# Patient Record
Sex: Female | Born: 2005 | Hispanic: No | Marital: Single | Smoking: Never smoker
Health system: Southern US, Community
[De-identification: ages and names within clinical notes are randomized; demographics above are authoritative.]

## PROBLEM LIST (undated history)

## (undated) ENCOUNTER — Ambulatory Visit: Admission: EM

## (undated) DIAGNOSIS — F909 Attention-deficit hyperactivity disorder, unspecified type: Secondary | ICD-10-CM

## (undated) DIAGNOSIS — J45909 Unspecified asthma, uncomplicated: Secondary | ICD-10-CM

## (undated) DIAGNOSIS — R519 Headache, unspecified: Secondary | ICD-10-CM

## (undated) DIAGNOSIS — R51 Headache: Secondary | ICD-10-CM

## (undated) DIAGNOSIS — F32A Depression, unspecified: Secondary | ICD-10-CM

## (undated) DIAGNOSIS — F419 Anxiety disorder, unspecified: Secondary | ICD-10-CM

## (undated) HISTORY — DX: Anxiety disorder, unspecified: F41.9

## (undated) HISTORY — DX: Headache: R51

## (undated) HISTORY — DX: Headache, unspecified: R51.9

## (undated) HISTORY — DX: Unspecified asthma, uncomplicated: J45.909

## (undated) HISTORY — DX: Attention-deficit hyperactivity disorder, unspecified type: F90.9

---
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
## 2023-01-06 ENCOUNTER — Ambulatory Visit (INDEPENDENT_AMBULATORY_CARE_PROVIDER_SITE_OTHER): Payer: Medicaid Other | Admitting: Women's Health

## 2023-01-06 ENCOUNTER — Encounter: Payer: Self-pay | Admitting: Women's Health

## 2023-01-06 VITALS — BP 128/58 | HR 92 | Wt 166.0 lb

## 2023-01-06 DIAGNOSIS — Z3483 Encounter for supervision of other normal pregnancy, third trimester: Secondary | ICD-10-CM

## 2023-01-06 DIAGNOSIS — Z3A32 32 weeks gestation of pregnancy: Secondary | ICD-10-CM

## 2023-01-06 DIAGNOSIS — Z3480 Encounter for supervision of other normal pregnancy, unspecified trimester: Secondary | ICD-10-CM

## 2023-01-06 NOTE — Patient Instructions (Signed)
Nichole Ramirez, thank you for choosing our office today! We appreciate the opportunity to meet your healthcare needs. You may receive a short survey by mail, e-mail, or through [employer]. If you are happy with your care we would appreciate if you could take just a few minutes to complete the survey questions. We read all of your comments and take your feedback very seriously. Thank you again for choosing our office.  Center for [employer] Team at [place] at [facility] ([place], [place] [address]) Entrance C, located off of [address] parking   CLASSES: Go to [email or web address] to register for classes (childbirth, breastfeeding, waterbirth, infant CPR, daddy bootcamp, etc.)  Call the office [phone]) or go to [facility] if: You begin to have strong, frequent contractions Your water breaks.  Sometimes it is a big gush of fluid, sometimes it is just a trickle that keeps getting your panties wet or running down your legs You have vaginal bleeding.  It is normal to have a small amount of spotting if your cervix was checked.  You don't feel your baby moving like normal.  If you don't, get you something to eat and drink and lay down and focus on feeling your baby move.   If your baby is still not moving like normal, you should call the office or go to [facility]  Call the office [phone]) or go to [facility] hospital for these signs of pre-eclampsia: Severe headache that does not go away with Tylenol Visual changes- seeing spots, double, blurred vision Pain under your right breast or upper abdomen that does not go away with Tums or heartburn medicine Nausea and/or vomiting Severe swelling in your hands, feet, and face   Tdap Vaccine It is recommended that you get the Tdap vaccine during the third trimester of EACH pregnancy to help protect your baby from getting pertussis (whooping cough) 27-36 weeks is the BEST time to do  this so that you can pass the protection on to your baby. During pregnancy is better than after pregnancy, but if you are unable to get it during pregnancy it will be offered at the hospital.  You can get this vaccine with [place], at the health department, your family doctor, or some local pharmacies Everyone who will be around your baby should also be up-to-date on their vaccines before the baby comes. Adults (who are not pregnant) only need 1 dose of Tdap during adulthood.   [facility] Pediatricians/Family Doctors [facility]): [address] [place], [facility]: [address] [place], [phone]                [facility]): [place], [phone] (call to ask if accepting patients) [facility] Department: [address], [place], [place] Pediatricians/Family Doctors [facility]): [place]. [place], [address], [facility]: [address] [place], [facility]: [place], [place] [facility]): [phone] [facility]: [address] [place]: [address], [facility]: [phone], [phone]  Home Blood Pressure Monitoring for Patients   Your provider has recommended that you check your  blood pressure (BP) at least once a week at home. If you do not have a blood pressure cuff at home, one will be provided for you. Contact your provider if you have not received your monitor within 1 week.   Helpful Tips for Accurate Home Blood Pressure Checks  Don't smoke, exercise, or drink caffeine 30 minutes before checking your BP Use the restroom before checking your BP (a full bladder can raise your  pressure) Relax in a comfortable upright chair Feet on the ground Left arm resting comfortably on a flat surface at the level of your heart Legs uncrossed Back supported Sit quietly and don't talk Place the cuff on your bare arm Adjust snuggly, so that only two fingertips can fit between your skin and the top of the cuff Check 2 readings separated by at least one minute Keep a log of your BP readings For a visual, please reference this diagram: http://ccnc.care/bpdiagram  Provider Name: [facility]     Phone: [phone]  Zone 1: ALL CLEAR  Continue to monitor your symptoms:  BP reading is less than 140 (top number) or less than 90 (bottom number)  No right upper stomach pain No headaches or seeing spots No feeling nauseated or throwing up No swelling in face and hands  Zone 2: CAUTION Call your doctor's office for any of the following:  BP reading is greater than 140 (top number) or greater than 90 (bottom number)  Stomach pain under your ribs in the middle or right side Headaches or seeing spots Feeling nauseated or throwing up Swelling in face and hands  Zone 3: EMERGENCY  Seek immediate medical care if you have any of the following:  BP reading is greater than160 (top number) or greater than 110 (bottom number) Severe headaches not improving with Tylenol Serious difficulty catching your breath Any worsening symptoms from Zone 2  Preterm Labor and Birth Information  The normal length of a pregnancy is 39-41 weeks. Preterm labor is when labor starts before 37 completed weeks of pregnancy. What are the risk factors for preterm labor? Preterm labor is more likely to occur in women who: Have certain infections during pregnancy such as a bladder infection, sexually transmitted infection, or infection inside the uterus (chorioamnionitis). Have a shorter-than-normal cervix. Have gone into preterm labor before. Have had surgery on their cervix. Are younger than age 17  or older than age 35. Are African American. Are pregnant with twins or multiple babies (multiple gestation). Take street drugs or smoke while pregnant. Do not gain enough weight while pregnant. Became pregnant shortly after having been pregnant. What are the symptoms of preterm labor? Symptoms of preterm labor include: Cramps similar to those that can happen during a menstrual period. The cramps may happen with diarrhea. Pain in the abdomen or lower back. Regular uterine contractions that may feel like tightening of the abdomen. A feeling of increased pressure in the pelvis. Increased watery or bloody mucus discharge from the vagina. Water breaking (ruptured amniotic sac). Why is it important to recognize signs of preterm labor? It is important to recognize signs of preterm labor because babies who are born prematurely may not be fully developed. This can put them at an increased risk for: Long-term (chronic) heart and lung problems. Difficulty immediately after birth with regulating body systems, including blood sugar, body temperature, heart rate, and breathing rate. Bleeding in the brain. Cerebral palsy. Learning difficulties. Death. These risks are highest for babies who are born before 34 weeks   of pregnancy. How is preterm labor treated? Treatment depends on the length of your pregnancy, your condition, and the health of your baby. It may involve: Having a stitch (suture) placed in your cervix to prevent your cervix from opening too early (cerclage). Taking or being given medicines, such as: Hormone medicines. These may be given early in pregnancy to help support the pregnancy. Medicine to stop contractions. Medicines to help mature the baby's lungs. These may be prescribed if the risk of delivery is high. Medicines to prevent your baby from developing cerebral palsy. If the labor happens before 34 weeks of pregnancy, you may need to stay in the hospital. What should I do if I  think I am in preterm labor? If you think that you are going into preterm labor, call your health care provider right away. How can I prevent preterm labor in future pregnancies? To increase your chance of having a full-term pregnancy: Do not use any tobacco products, such as cigarettes, chewing tobacco, and e-cigarettes. If you need help quitting, ask your health care provider. Do not use street drugs or medicines that have not been prescribed to you during your pregnancy. Talk with your health care provider before taking any herbal supplements, even if you have been taking them regularly. Make sure you gain a healthy amount of weight during your pregnancy. Watch for infection. If you think that you might have an infection, get it checked right away. Make sure to tell your health care provider if you have gone into preterm labor before. This information is not intended to replace advice given to you by your health care provider. Make sure you discuss any questions you have with your health care provider. Document Revised: 01/14/2019 Document Reviewed: 02/13/2016 Elsevier Patient Education  2020 [employer] Inc.   

## 2023-01-06 NOTE — Progress Notes (Signed)
LOW-RISK PREGNANCY VISIT Patient name: Nichole Ramirez MRN [id number]  Date of birth: 08-Oct-2005 Chief Complaint:   Routine Prenatal Visit  History of Present Illness:   Nichole Ramirez is a 17 y.o. G33P0010 female at [redacted]w[redacted]d with an Estimated Date of Delivery: 03/02/23 being seen today for ongoing management of a low-risk pregnancy.   Today she reports no complaints. Hasn't gotten bp cuff yet.  Contractions: Irritability.  .  Movement: Present. denies leaking of fluid.     08/20/2022   10:41 AM 07/10/2021    4:32 PM 06/05/2021    3:43 PM 01/04/2021   12:49 PM  Depression screen PHQ 2/9  Decreased Interest 0  0 0  Down, Depressed, Hopeless 1  1 0  PHQ - 2 Score 1  1 0  Altered sleeping 1  1 2   Tired, decreased energy 2  1 0  Change in appetite 1  0 0  Feeling bad or failure about yourself  0  0 0  Trouble concentrating 2  2 1   Moving slowly or fidgety/restless 0  0 0  Suicidal thoughts 0  0 0  PHQ-9 Score 7  5 3   Difficult doing work/chores    Not difficult at all     Information is confidential and restricted. Go to Review Flowsheets to unlock data.        08/20/2022   10:42 AM 06/05/2021    3:45 PM  GAD 7 : Generalized Anxiety Score  Nervous, Anxious, on Edge 1 1  Control/stop worrying 0 0  Worry too much - different things 0 1  Trouble relaxing 0 0  Restless 0 0  Easily annoyed or irritable 1 1  Afraid - awful might happen 0 0  Total GAD 7 Score 2 3      Review of Systems:   Pertinent items are noted in HPI Denies abnormal vaginal discharge w/ itching/odor/irritation, headaches, visual changes, shortness of breath, chest pain, abdominal pain, severe nausea/vomiting, or problems with urination or bowel movements unless otherwise stated above. Pertinent History Reviewed:  Reviewed past medical,surgical, social, obstetrical and family history.  Reviewed problem list, medications and allergies. Physical Assessment:   Vitals:   01/06/23 1337  BP: (!) 128/58  Pulse:  92  Weight: 166 lb (75.3 kg)  There is no height or weight on file to calculate BMI.        Physical Examination:   General appearance: Well appearing, and in no distress  Mental status: Alert, oriented to person, place, and time  Skin: Warm & dry  Cardiovascular: Normal heart rate noted  Respiratory: Normal respiratory effort, no distress  Abdomen: Soft, gravid, nontender  Pelvic: Cervical exam deferred         Extremities: Edema: None  Fetal Status: Fetal Heart Rate (bpm): 145 Fundal Height: 29 cm Movement: Present    Chaperone: N/A   No results found for this or any previous visit (from the past 24 hour(s)).  Assessment & Plan:  1) Low-risk pregnancy G2P0010 at [redacted]w[redacted]d with an Estimated Date of Delivery: 03/02/23   2) S<D> EFW for same @ 31wks, 67% w/ normal fluid   Meds: No orders of the defined types were placed in this encounter.  Labs/procedures today: none, note routed to Tish to check on bp cuff  Plan:  Continue routine obstetrical care  Next visit: prefers in person    Reviewed: Preterm labor symptoms and general obstetric precautions including but not limited to vaginal bleeding, contractions, leaking  of fluid and fetal movement were reviewed in detail with the patient.  All questions were answered.   Follow-up: Return in about 2 weeks (around 01/20/2023) for [place], [place], in person.  Future Appointments  Date Time Provider [place]  01/20/2023  2:30 PM Roma Schanz, CNM [facility]    No orders of the defined types were placed in this encounter.  [place], [facility] 01/06/2023 2:09 PM

## 2023-01-20 ENCOUNTER — Ambulatory Visit (INDEPENDENT_AMBULATORY_CARE_PROVIDER_SITE_OTHER): Payer: Medicaid Other | Admitting: Women's Health

## 2023-01-20 ENCOUNTER — Encounter: Payer: Self-pay | Admitting: Women's Health

## 2023-01-20 VITALS — BP 114/70 | HR 76 | Wt 165.8 lb

## 2023-01-20 DIAGNOSIS — Z3A34 34 weeks gestation of pregnancy: Secondary | ICD-10-CM

## 2023-01-20 DIAGNOSIS — Z3483 Encounter for supervision of other normal pregnancy, third trimester: Secondary | ICD-10-CM

## 2023-01-20 NOTE — Progress Notes (Signed)
LOW-RISK PREGNANCY VISIT Patient name: Nichole Ramirez MRN [id number]  Date of birth: Jun 24, 2006 Chief Complaint:   Routine Prenatal Visit  History of Present Illness:   Nichole Ramirez is a 17 y.o. G2P0010 female at [redacted]w[redacted]d with an Estimated Date of Delivery: 03/02/23 being seen today for ongoing management of a low-risk pregnancy.   Today she reports round ligament pain. Contractions: Not present. Vag. Bleeding: None.  Movement: Present. denies leaking of fluid.     08/20/2022   10:41 AM 07/10/2021    4:32 PM 06/05/2021    3:43 PM 01/04/2021   12:49 PM  Depression screen PHQ 2/9  Decreased Interest 0  0 0  Down, Depressed, Hopeless 1  1 0  PHQ - 2 Score 1  1 0  Altered sleeping Tired, decreased energy 2  1 0  Change in appetite 1  0 0  Feeling bad or failure about yourself  0  0 0  Trouble concentrating Moving slowly or fidgety/restless 0  0 0  Suicidal thoughts 0  0 0  PHQ-9 Score Difficult doing work/chores    Not difficult at all     Information is confidential and restricted. Go to Review Flowsheets to unlock data.        08/20/2022   10:42 AM 06/05/2021    3:45 PM  GAD 7 : Generalized Anxiety Score  Nervous, Anxious, on Edge 1 1  Control/stop worrying 0 0  Worry too much - different things 0 1  Trouble relaxing 0 0  Restless 0 0  Easily annoyed or irritable 1 1  Afraid - awful might happen 0 0  Total GAD 7 Score 2 3      Review of Systems:   Pertinent items are noted in HPI Denies abnormal vaginal discharge w/ itching/odor/irritation, headaches, visual changes, shortness of breath, chest pain, abdominal pain, severe nausea/vomiting, or problems with urination or bowel movements unless otherwise stated above. Pertinent History Reviewed:  Reviewed past medical,surgical, social, obstetrical and family history.  Reviewed problem list, medications and allergies. Physical Assessment:   Vitals:   01/20/23 1439  BP: 114/70  Pulse: 76   Weight: 165 lb 12.8 oz (75.2 kg)  There is no height or weight on file to calculate BMI.        Physical Examination:   General appearance: Well appearing, and in no distress  Mental status: Alert, oriented to person, place, and time  Skin: Warm & dry  Cardiovascular: Normal heart rate noted  Respiratory: Normal respiratory effort, no distress  Abdomen: Soft, gravid, nontender  Pelvic: Cervical exam deferred         Extremities:    Fetal Status: Fetal Heart Rate (bpm): 138 Fundal Height: 31 cm Movement: Present    Chaperone: N/A   No results found for this or any previous visit (from the past 24 hour(s)).  Assessment & Plan:  1) Low-risk pregnancy G2P0010 at [redacted]w[redacted]d with an Estimated Date of Delivery: 03/02/23   2) Measuring size <dates, EFW 67% @ 31wks   Meds: No orders of the defined types were placed in this encounter.  Labs/procedures today: none  Plan:  Continue routine obstetrical care  Next visit: prefers will be in person for cultures     Reviewed: Preterm labor symptoms and general obstetric precautions including but not limited to vaginal bleeding, contractions, leaking of fluid and fetal movement were reviewed in detail  with the patient.  All questions were answered. Does have home bp cuff. Office bp cuff given: not applicable. Check bp weekly, let us know if consistently >140 and/or >90.  Follow-up: Return in about 2 weeks (around 02/03/2023) for [facility], CNM, in person.  No future appointments.  No orders of the defined types were placed in this encounter.  Cheral Marker CNM, [facility] 01/20/2023 2:57 PM

## 2023-01-20 NOTE — Patient Instructions (Signed)
Nichole Ramirez, thank you for choosing our office today! We appreciate the opportunity to meet your healthcare needs. You may receive a short survey by mail, e-mail, or through MyChart. If you are happy with your care we would appreciate if you could take just a few minutes to complete the survey questions. We read all of your comments and take your feedback very seriously. Thank you again for choosing our office.  [facility] ([address] [place], [place] [address]) Entrance C, located off of [address] Free 24/7 valet parking   CLASSES: Go to [email or web address] to register for classes (childbirth, breastfeeding, waterbirth, infant CPR, daddy bootcamp, etc.)  Call the office ([phone]) or go to [facility] if: You begin to have strong, frequent contractions Your water breaks.  Sometimes it is a big gush of fluid, sometimes it is just a trickle that keeps getting your panties wet or running down your legs You have vaginal bleeding.  It is normal to have a small amount of spotting if your cervix was checked.  You don't feel your baby moving like normal.  If you don't, get you something to eat and drink and lay down and focus on feeling your baby move.   If your baby is still not moving like normal, you should call the office or go to [facility].  Call the office ([phone]) or go to [facility] for these signs of pre-eclampsia: Severe headache that does not go away with Tylenol Visual changes- seeing spots, double, blurred vision Pain under your right breast or upper abdomen that does not go away with Tums or heartburn medicine Nausea and/or vomiting Severe swelling in your hands, feet, and face   Tdap Vaccine It is recommended that you get the Tdap vaccine during the third trimester of EACH pregnancy to help protect your baby from getting pertussis (whooping cough) 27-36 weeks is the BEST time to do  this so that you can pass the protection on to your baby. During pregnancy is better than after pregnancy, but if you are unable to get it during pregnancy it will be offered at the hospital.  You can get this vaccine with us, at the health department, your family doctor, or some local pharmacies Everyone who will be around your baby should also be up-to-date on their vaccines before the baby comes. Adults (who are not pregnant) only need 1 dose of Tdap during adulthood.   [place] Pediatricians/Family Doctors [facility] ([facility]): [address], [phone]           [facility]: [address], [phone]                [facility] ([facility]): [address], [phone] (call to ask if accepting patients) [facility]: [address], [place], [phone]    [place] Pediatricians/Family Doctors [facility] ([facility]): [address], [phone] [facility]: [address], [phone] [facility]: [address], [phone]  [place] [facility]  [facility] ([facility]): [phone] [facility]: [address], [phone]   [place] Family [facility]: [address], [phone]  [place] [facility]  [facility]: [address], [phone]  Home Blood Pressure Monitoring for Patients   Your provider has recommended that you check your   blood pressure (BP) at least once a week at home. If you do not have a blood pressure cuff at home, one will be provided for you. Contact your provider if you have not received your monitor within 1 week.   Helpful Tips for Accurate Home Blood Pressure Checks  Don't smoke, exercise, or drink caffeine 30 minutes before checking your BP Use the restroom before checking your BP (a full bladder can raise your  pressure) Relax in a comfortable upright chair Feet on the ground Left arm resting comfortably on a flat surface at the level of your heart Legs uncrossed Back supported Sit quietly and don't talk Place the cuff on your bare arm Adjust snuggly, so that only two fingertips can fit between your skin and the top of the cuff Check 2 readings separated by at least one minute Keep a log of your BP readings For a visual, please reference this diagram: http://ccnc.care/bpdiagram  Provider Name: [facility]     Phone: [phone]  Zone 1: ALL CLEAR  Continue to monitor your symptoms:  BP reading is less than 140 (top number) or less than 90 (bottom number)  No right upper stomach pain No headaches or seeing spots No feeling nauseated or throwing up No swelling in face and hands  Zone 2: CAUTION Call your doctor's office for any of the following:  BP reading is greater than 140 (top number) or greater than 90 (bottom number)  Stomach pain under your ribs in the middle or right side Headaches or seeing spots Feeling nauseated or throwing up Swelling in face and hands  Zone 3: EMERGENCY  Seek immediate medical care if you have any of the following:  BP reading is greater than160 (top number) or greater than 110 (bottom number) Severe headaches not improving with Tylenol Serious difficulty catching your breath Any worsening symptoms from Zone 2  Preterm Labor and Birth Information  The normal length of a pregnancy is 39-41 weeks. Preterm labor is when labor starts before 37 completed weeks of pregnancy. What are the risk factors for preterm labor? Preterm labor is more likely to occur in women who: Have certain infections during pregnancy such as a bladder infection, sexually transmitted infection, or infection inside the uterus (chorioamnionitis). Have a shorter-than-normal cervix. Have gone into preterm labor before. Have had surgery on their cervix. Are younger than age 17  or older than age 35. Are African American. Are pregnant with twins or multiple babies (multiple gestation). Take street drugs or smoke while pregnant. Do not gain enough weight while pregnant. Became pregnant shortly after having been pregnant. What are the symptoms of preterm labor? Symptoms of preterm labor include: Cramps similar to those that can happen during a menstrual period. The cramps may happen with diarrhea. Pain in the abdomen or lower back. Regular uterine contractions that may feel like tightening of the abdomen. A feeling of increased pressure in the pelvis. Increased watery or bloody mucus discharge from the vagina. Water breaking (ruptured amniotic sac). Why is it important to recognize signs of preterm labor? It is important to recognize signs of preterm labor because babies who are born prematurely may not be fully developed. This can put them at an increased risk for: Long-term (chronic) heart and lung problems. Difficulty immediately after birth with regulating body systems, including blood sugar, body temperature, heart rate, and breathing rate. Bleeding in the brain. Cerebral palsy. Learning difficulties. Death. These risks are highest for babies who are born before 34 weeks   of pregnancy. How is preterm labor treated? Treatment depends on the length of your pregnancy, your condition, and the health of your baby. It may involve: Having a stitch (suture) placed in your cervix to prevent your cervix from opening too early (cerclage). Taking or being given medicines, such as: Hormone medicines. These may be given early in pregnancy to help support the pregnancy. Medicine to stop contractions. Medicines to help mature the baby's lungs. These may be prescribed if the risk of delivery is high. Medicines to prevent your baby from developing cerebral palsy. If the labor happens before 34 weeks of pregnancy, you may need to stay in the hospital. What should I do if I  think I am in preterm labor? If you think that you are going into preterm labor, call your health care provider right away. How can I prevent preterm labor in future pregnancies? To increase your chance of having a full-term pregnancy: Do not use any tobacco products, such as cigarettes, chewing tobacco, and e-cigarettes. If you need help quitting, ask your health care provider. Do not use street drugs or medicines that have not been prescribed to you during your pregnancy. Talk with your health care provider before taking any herbal supplements, even if you have been taking them regularly. Make sure you gain a healthy amount of weight during your pregnancy. Watch for infection. If you think that you might have an infection, get it checked right away. Make sure to tell your health care provider if you have gone into preterm labor before. This information is not intended to replace advice given to you by your health care provider. Make sure you discuss any questions you have with your health care provider. Document Revised: 01/14/2019 Document Reviewed: 02/13/2016 Elsevier Patient Education  2020 Elsevier Inc.   

## 2023-02-03 ENCOUNTER — Ambulatory Visit (INDEPENDENT_AMBULATORY_CARE_PROVIDER_SITE_OTHER): Payer: Medicaid Other | Admitting: Obstetrics and Gynecology

## 2023-02-03 ENCOUNTER — Encounter: Payer: Self-pay | Admitting: Obstetrics and Gynecology

## 2023-02-03 ENCOUNTER — Other Ambulatory Visit (HOSPITAL_COMMUNITY)
Admission: RE | Admit: 2023-02-03 | Discharge: 2023-02-03 | Disposition: A | Discharge status: Home / Self Care | Payer: Medicaid Other | Source: Ambulatory Visit | Attending: Obstetrics and Gynecology | Admitting: Obstetrics and Gynecology

## 2023-02-03 VITALS — BP 129/70 | HR 77 | Wt 170.2 lb

## 2023-02-03 DIAGNOSIS — O99013 Anemia complicating pregnancy, third trimester: Secondary | ICD-10-CM

## 2023-02-03 DIAGNOSIS — Z3A36 36 weeks gestation of pregnancy: Secondary | ICD-10-CM | POA: Diagnosis not present

## 2023-02-03 DIAGNOSIS — Z3480 Encounter for supervision of other normal pregnancy, unspecified trimester: Secondary | ICD-10-CM | POA: Diagnosis not present

## 2023-02-03 DIAGNOSIS — R8271 Bacteriuria: Secondary | ICD-10-CM

## 2023-02-03 NOTE — Progress Notes (Signed)
Subjective:  Nichole Ramirez is a 17 y.o. G2P0010 at [redacted]w[redacted]d being seen today for ongoing prenatal care.  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  Patient reports  general discomforts of pregnancy .  Contractions: Irregular. Vag. Bleeding: None.  Movement: Present. Denies leaking of fluid.   The following portions of the patient's history were reviewed and updated as appropriate: allergies, current medications, past family history, past medical history, past social history, past surgical history and problem list. Problem list updated.  Objective:   Vitals:   02/03/23 1417  BP: 129/70  Pulse: 77  Weight: 170 lb 3.2 oz (77.2 kg)    Fetal Status:     Movement: Present     General:  Alert, oriented and cooperative. Patient is in no acute distress.  Skin: Skin is warm and dry. No rash noted.   Cardiovascular: Normal heart rate noted  Respiratory: Normal respiratory effort, no problems with respiration noted  Abdomen: Soft, gravid, appropriate for gestational age. Pain/Pressure: Present     Pelvic:  Cervical exam performed        Extremities: Normal range of motion.     Mental Status: Normal mood and affect. Normal behavior. Normal judgment and thought content.   Urinalysis:      Assessment and Plan:  Pregnancy: G2P0010 at [redacted]w[redacted]d  1. [redacted] weeks gestation of pregnancy  - Cervicovaginal ancillary only( [place])  2. Supervision of other normal pregnancy, antepartum Stable Labor precautions - Cervicovaginal ancillary only( [place])  3. Group B streptococcal bacteriuria Tx while in labor  4. Anemia affecting pregnancy in third trimester Continue with iron supplement  Term labor symptoms and general obstetric  precautions including but not limited to vaginal bleeding, contractions, leaking of fluid and fetal movement were reviewed in detail with the patient. Please refer to After Visit Summary for other counseling recommendations.  Return in about 1 week (around 02/10/2023) for OB visit, face to face, any provider.   Hermina Staggers, MD

## 2023-02-05 LAB — CERVICOVAGINAL ANCILLARY ONLY
Chlamydia: NEGATIVE
Comment: NEGATIVE
Comment: NORMAL
Neisseria Gonorrhea: NEGATIVE

## 2023-02-10 ENCOUNTER — Encounter: Payer: Self-pay | Admitting: Women's Health

## 2023-02-10 ENCOUNTER — Ambulatory Visit (INDEPENDENT_AMBULATORY_CARE_PROVIDER_SITE_OTHER): Payer: Medicaid Other | Admitting: Women's Health

## 2023-02-10 VITALS — BP 115/66 | HR 69 | Wt 174.0 lb

## 2023-02-10 DIAGNOSIS — Z3480 Encounter for supervision of other normal pregnancy, unspecified trimester: Secondary | ICD-10-CM

## 2023-02-10 DIAGNOSIS — Z3483 Encounter for supervision of other normal pregnancy, third trimester: Secondary | ICD-10-CM

## 2023-02-10 DIAGNOSIS — Z3A37 37 weeks gestation of pregnancy: Secondary | ICD-10-CM

## 2023-02-10 NOTE — Progress Notes (Signed)
LOW-RISK PREGNANCY VISIT Patient name: Nichole Ramirez MRN [id number]  Date of birth: August 09, 2006 Chief Complaint:   Routine Prenatal Visit (Need refill vitamin)  History of Present Illness:   Nichole Ramirez is a 17 y.o. G18P0010 female at [redacted]w[redacted]d with an Estimated Date of Delivery: 03/02/23 being seen today for ongoing management of a low-risk pregnancy.   Today she reports no complaints. Contractions: Irregular.  .  Movement: Present. denies leaking of fluid.     08/20/2022   10:41 AM 07/10/2021    4:32 PM 06/05/2021    3:43 PM 01/04/2021   12:49 PM  Depression screen PHQ 2/9  Decreased Interest 0  0 0  Down, Depressed, Hopeless 1  1 0  PHQ - 2 Score 1  1 0  Altered sleeping 1  1 2   Tired, decreased energy 2  1 0  Change in appetite 1  0 0  Feeling bad or failure about yourself  0  0 0  Trouble concentrating 2  2 1   Moving slowly or fidgety/restless 0  0 0  Suicidal thoughts 0  0 0  PHQ-9 Score 7  5 3   Difficult doing work/chores    Not difficult at all     Information is confidential and restricted. Go to Review Flowsheets to unlock data.        08/20/2022   10:42 AM 06/05/2021    3:45 PM  GAD 7 : Generalized Anxiety Score  Nervous, Anxious, on Edge 1 1  Control/stop worrying 0 0  Worry too much - different things 0 1  Trouble relaxing 0 0  Restless 0 0  Easily annoyed or irritable 1 1  Afraid - awful might happen 0 0  Total GAD 7 Score 2 3      Review of Systems:   Pertinent items are noted in HPI Denies abnormal vaginal discharge w/ itching/odor/irritation, headaches, visual changes, shortness of breath, chest pain, abdominal pain, severe nausea/vomiting, or problems with urination or bowel movements unless otherwise stated above. Pertinent History Reviewed:  Reviewed past medical,surgical, social, obstetrical and family history.  Reviewed problem list, medications and allergies. Physical Assessment:   Vitals:   02/10/23 1418  BP: 115/66  Pulse: 69  Weight:  174 lb (78.9 kg)  There is no height or weight on file to calculate BMI.        Physical Examination:   General appearance: Well appearing, and in no distress  Mental status: Alert, oriented to person, place, and time  Skin: Warm & dry  Cardiovascular: Normal heart rate noted  Respiratory: Normal respiratory effort, no distress  Abdomen: Soft, gravid, nontender  Pelvic: Cervical exam deferred         Extremities: Edema: None  Fetal Status: Fetal Heart Rate (bpm): 138 Fundal Height: 34 cm Movement: Present    Chaperone: N/A   No results found for this or any previous visit (from the past 24 hour(s)).  Assessment & Plan:  1) Low-risk pregnancy G2P0010 at [redacted]w[redacted]d with an Estimated Date of Delivery: 03/02/23    Meds: No orders of the defined types were placed in this encounter.  Labs/procedures today: none  Plan:  Continue routine obstetrical care  Next visit: prefers in person    Reviewed: Preterm labor symptoms and general obstetric precautions including but not limited to vaginal bleeding, contractions, leaking of fluid and fetal movement were reviewed in detail with the patient.  All questions were answered.  Follow-up: Return for As scheduled.  Future  Appointments  Date Time Provider Department Center  02/17/2023  2:10 PM Cheral Marker, CNM [facility]  02/24/2023  2:10 PM Cheral Marker, CNM [facility]  03/03/2023  2:50 PM Cresenzo-Dishmon, Scarlette Calico, CNM [facility]    No orders of the defined types were placed in this encounter.  Cheral Marker CNM, [facility] 02/10/2023 2:38 PM

## 2023-02-10 NOTE — Patient Instructions (Signed)
Nichole Ramirez, thank you for choosing our office today! We appreciate the opportunity to meet your healthcare needs. You may receive a short survey by mail, e-mail, or through [employer]. If you are happy with your care we would appreciate if you could take just a few minutes to complete the survey questions. We read all of your comments and take your feedback very seriously. Thank you again for choosing our office.  Center for [employer] Team at [facility]  [facility] at [facility] ([address] [place], [place] [address]) Entrance C, located off of [address] Free 24/7 valet parking   CLASSES: Go to [email or web address] to register for classes (childbirth, breastfeeding, waterbirth, infant CPR, daddy bootcamp, etc.)  Call the office [phone]) or go to [facility] if: You begin to have strong, frequent contractions Your water breaks.  Sometimes it is a big gush of fluid, sometimes it is just a trickle that keeps getting your panties wet or running down your legs You have vaginal bleeding.  It is normal to have a small amount of spotting if your cervix was checked.  You don't feel your baby moving like normal.  If you don't, get you something to eat and drink and lay down and focus on feeling your baby move.   If your baby is still not moving like normal, you should call the office or go to [facility].  Call the office [phone]) or go to [facility] hospital for these signs of pre-eclampsia: Severe headache that does not go away with Tylenol Visual changes- seeing spots, double, blurred vision Pain under your right breast or upper abdomen that does not go away with Tums or heartburn medicine Nausea and/or vomiting Severe swelling in your hands, feet, and face   [facility] Pediatricians/Family Doctors [facility]): [address] Colette Ramirez, [phone]           [facility]: [address], [phone]                 [facility]): [address], [phone] (call to ask if accepting patients) Nichole Ramirez: [address] [place], [phone]    [facility] Pediatricians/Family Doctors [facility]): [address], [phone] [facility]: [address] [place], [phone] [facility]: [address], [phone]  [facility] Doctors  [facility]): [phone] [facility]: [address], [phone]   [facility] Doctors [facility]: [address] [phone]  [facility] Doctors  [facility]: [phone], [phone]  Home Blood Pressure Monitoring for Patients   Your provider has recommended that you check your blood pressure (BP) at least once a week at home. If you do not have a blood pressure cuff at home, one will be provided for you. Contact your provider if you have not received your monitor within 1 week.   Helpful Tips for Accurate Home Blood Pressure Checks  Don't smoke, exercise, or drink caffeine 30 minutes before checking your BP Use the restroom before checking your BP (a full bladder can raise your pressure) Relax in a comfortable upright chair Feet on the ground Left arm resting comfortably on a flat surface at the level of your heart Legs uncrossed Back supported Sit quietly and don't talk Place the cuff on your bare arm Adjust snuggly, so that only two fingertips  can fit between your skin and the top of the cuff Check 2 readings separated by at least one minute Keep a log of your BP readings For a visual, please reference this diagram: http://ccnc.care/bpdiagram  Provider Name: [facility]     Phone: [phone]  Zone 1: ALL CLEAR  Continue to monitor your symptoms:  BP reading is less than 140 (top number) or less than 90 (bottom number)  No right  upper stomach pain No headaches or seeing spots No feeling nauseated or throwing up No swelling in face and hands  Zone 2: CAUTION Call your doctor's office for any of the following:  BP reading is greater than 140 (top number) or greater than 90 (bottom number)  Stomach pain under your ribs in the middle or right side Headaches or seeing spots Feeling nauseated or throwing up Swelling in face and hands  Zone 3: EMERGENCY  Seek immediate medical care if you have any of the following:  BP reading is greater than160 (top number) or greater than 110 (bottom number) Severe headaches not improving with Tylenol Serious difficulty catching your breath Any worsening symptoms from Zone 2   Braxton Hicks Contractions Contractions of the uterus can occur throughout pregnancy, but they are not always a sign that you are in labor. You may have practice contractions called Braxton Hicks contractions. These false labor contractions are sometimes confused with true labor. What are [name] contractions? Braxton Hicks contractions are tightening movements that occur in the muscles of the uterus before labor. Unlike true labor contractions, these contractions do not result in opening (dilation) and thinning of the cervix. Toward the end of pregnancy (32-34 weeks), Braxton Hicks contractions can happen more often and may become stronger. These contractions are sometimes difficult to tell apart from true labor because they can be very uncomfortable. You should not feel embarrassed if you go to the hospital with false labor. Sometimes, the only way to tell if you are in true labor is for your health care provider to look for changes in the cervix. The health care provider will do a physical exam and may monitor your contractions. If you are not in true labor, the exam should show that your cervix is not dilating and your water has not broken. If there are no other health problems associated with your  pregnancy, it is completely safe for you to be sent home with false labor. You may continue to have Braxton Hicks contractions until you go into true labor. How to tell the difference between true labor and false labor True labor Contractions last 30-70 seconds. Contractions become very regular. Discomfort is usually felt in the top of the uterus, and it spreads to the lower abdomen and low back. Contractions do not go away with walking. Contractions usually become more intense and increase in frequency. The cervix dilates and gets thinner. False labor Contractions are usually shorter and not as strong as true labor contractions. Contractions are usually irregular. Contractions are often felt in the front of the lower abdomen and in the groin. Contractions may go away when you walk around or change positions while lying down. Contractions get weaker and are shorter-lasting as time goes on. The cervix usually does not dilate or become thin. Follow these instructions at home:  Take over-the-counter and prescription medicines only as told by your health care provider. Keep up with your usual exercises and follow other instructions from your health care provider. Eat and drink lightly if you think  you are going into labor. If Braxton Hicks contractions are making you uncomfortable: Change your position from lying down or resting to walking, or change from walking to resting. Sit and rest in a tub of warm water. Drink enough fluid to keep your urine pale yellow. Dehydration may cause these contractions. Do slow and deep breathing several times an hour. Keep all follow-up prenatal visits as told by your health care provider. This is important. Contact a health care provider if: You have a fever. You have continuous pain in your abdomen. Get help right away if: Your contractions become stronger, more regular, and closer together. You have fluid leaking or gushing from your vagina. You pass  blood-tinged mucus (bloody show). You have bleeding from your vagina. You have low back pain that you never had before. You feel your baby's head pushing down and causing pelvic pressure. Your baby is not moving inside you as much as it used to. Summary Contractions that occur before labor are called Braxton Hicks contractions, false labor, or practice contractions. Braxton Hicks contractions are usually shorter, weaker, farther apart, and less regular than true labor contractions. True labor contractions usually become progressively stronger and regular, and they become more frequent. Manage discomfort from [facility] contractions by changing position, resting in a warm bath, drinking plenty of water, or practicing deep breathing. This information is not intended to replace advice given to you by your health care provider. Make sure you discuss any questions you have with your health care provider. Document Revised: 09/04/2017 Document Reviewed: 02/05/2017 Elsevier Patient Education  [place].

## 2023-02-17 ENCOUNTER — Encounter: Payer: Medicaid Other | Admitting: Women's Health

## 2023-02-18 ENCOUNTER — Encounter: Payer: Medicaid Other | Admitting: Advanced Practice Midwife

## 2023-02-24 ENCOUNTER — Ambulatory Visit (INDEPENDENT_AMBULATORY_CARE_PROVIDER_SITE_OTHER): Payer: Medicaid Other | Admitting: Women's Health

## 2023-02-24 ENCOUNTER — Encounter: Payer: Self-pay | Admitting: Women's Health

## 2023-02-24 VITALS — BP 122/65 | HR 74 | Wt 176.4 lb

## 2023-02-24 DIAGNOSIS — Z3483 Encounter for supervision of other normal pregnancy, third trimester: Secondary | ICD-10-CM

## 2023-02-24 DIAGNOSIS — Z3A39 39 weeks gestation of pregnancy: Secondary | ICD-10-CM

## 2023-02-24 DIAGNOSIS — Z3480 Encounter for supervision of other normal pregnancy, unspecified trimester: Secondary | ICD-10-CM

## 2023-02-24 MED ORDER — PRENATAL VITAMIN 27-0.8 MG PO TABS: 1.0000 | ORAL_TABLET | Freq: Every day | ORAL | 3 refills | Status: DC

## 2023-02-24 NOTE — Progress Notes (Signed)
LOW-RISK PREGNANCY VISIT Patient name: MARTICIA PUSATERI MRN [id number]  Date of birth: 2005/12/21 Chief Complaint:   Routine Prenatal Visit (Cervical check)  History of Present Illness:   KORTNI BODNAR is a 17 y.o. G2P0010 female at [redacted]w[redacted]d with an Estimated Date of Delivery: 03/02/23 being seen today for ongoing management of a low-risk pregnancy.   Today she reports no complaints. Contractions: Irregular.  .  Movement: Present. denies leaking of fluid.     08/20/2022   10:41 AM 07/10/2021    4:32 PM 06/05/2021    3:43 PM 01/04/2021   12:49 PM  Depression screen PHQ 2/9  Decreased Interest 0  0 0  Down, Depressed, Hopeless 1  1 0  PHQ - 2 Score 1  1 0  Altered sleeping 1  1 2   Tired, decreased energy 2  1 0  Change in appetite 1  0 0  Feeling bad or failure about yourself  0  0 0  Trouble concentrating 2  2 1   Moving slowly or fidgety/restless 0  0 0  Suicidal thoughts 0  0 0  PHQ-9 Score 7  5 3   Difficult doing work/chores    Not difficult at all     Information is confidential and restricted. Go to Review Flowsheets to unlock data.        08/20/2022   10:42 AM 06/05/2021    3:45 PM  GAD 7 : Generalized Anxiety Score  Nervous, Anxious, on Edge 1 1  Control/stop worrying 0 0  Worry too much - different things 0 1  Trouble relaxing 0 0  Restless 0 0  Easily annoyed or irritable 1 1  Afraid - awful might happen 0 0  Total GAD 7 Score 2 3      Review of Systems:   Pertinent items are noted in HPI Denies abnormal vaginal discharge w/ itching/odor/irritation, headaches, visual changes, shortness of breath, chest pain, abdominal pain, severe nausea/vomiting, or problems with urination or bowel movements unless otherwise stated above. Pertinent History Reviewed:  Reviewed past medical,surgical, social, obstetrical and family history.  Reviewed problem list, medications and allergies. Physical Assessment:   Vitals:   02/24/23 1420  BP: 122/65  Pulse: 74  Weight: 176  lb 6.4 oz (80 kg)  There is no height or weight on file to calculate BMI.        Physical Examination:   General appearance: Well appearing, and in no distress  Mental status: Alert, oriented to person, place, and time  Skin: Warm & dry  Cardiovascular: Normal heart rate noted  Respiratory: Normal respiratory effort, no distress  Abdomen: Soft, gravid, nontender  Pelvic: Cervical exam performed  Dilation: 1.5 Effacement (%): 80 Station: -2  Extremities: Edema: Trace  Fetal Status: Fetal Heart Rate (bpm): 142 Fundal Height: 32 cm Movement: Present Presentation: Vertex  Chaperone: Latisha Cresenzo   No results found for this or any previous visit (from the past 24 hour(s)).  Assessment & Plan:  1) Low-risk pregnancy G2P0010 at [redacted]w[redacted]d with an Estimated Date of Delivery: 03/02/23   2) Uterine size <dates- did u/s for same at 31wks, EFW 67%, will check again   Meds:  Meds ordered this encounter  Medications   Prenatal Vit-Fe Fumarate-FA (PRENATAL VITAMIN) 27-0.8 MG TABS    Sig: Take 1 tablet by mouth daily.    Dispense:  90 tablet    Refill:  3   Labs/procedures today: SVE  Plan:  Continue routine obstetrical care  Next  visit: prefers in person    Reviewed: Term labor symptoms and general obstetric precautions including but not limited to vaginal bleeding, contractions, leaking of fluid and fetal movement were reviewed in detail with the patient.  All questions were answered. Does have home bp cuff. Office bp cuff given: not applicable. Check bp weekly, let us know if consistently >140 and/or >90.  Follow-up: Return for ASAP, US:EFW (no visit); then as scheduled.  Future Appointments  Date Time Provider Department Center  03/03/2023  2:50 PM Cresenzo-Dishmon, Scarlette Calico, CNM [facility]    No orders of the defined types were placed in this encounter.  Cheral Marker CNM, [facility] 02/24/2023 2:45 PM

## 2023-02-24 NOTE — Patient Instructions (Signed)
Suriyah, thank you for choosing our office today! We appreciate the opportunity to meet your healthcare needs. You may receive a short survey by mail, e-mail, or through MyChart. If you are happy with your care we would appreciate if you could take just a few minutes to complete the survey questions. We read all of your comments and take your feedback very seriously. Thank you again for choosing our office.  [facility] Team at [facility]  [facility] [place] ([address] [place], [place] [address]) Entrance C, located off of [address] Free 24/7 valet parking   CLASSES: Go to [email or web address] to register for classes (childbirth, breastfeeding, waterbirth, infant CPR, daddy bootcamp, etc.)  Call the office ([phone]) or go to [facility] if: You begin to have strong, frequent contractions Your water breaks.  Sometimes it is a big gush of fluid, sometimes it is just a trickle that keeps getting your panties wet or running down your legs You have vaginal bleeding.  It is normal to have a small amount of spotting if your cervix was checked.  You don't feel your baby moving like normal.  If you don't, get you something to eat and drink and lay down and focus on feeling your baby move.   If your baby is still not moving like normal, you should call the office or go to [facility].  Call the office ([phone]) or go to [facility] for these signs of pre-eclampsia: Severe headache that does not go away with Tylenol Visual changes- seeing spots, double, blurred vision Pain under your right breast or upper abdomen that does not go away with Tums or heartburn medicine Nausea and/or vomiting Severe swelling in your hands, feet, and face   [place] Pediatricians/Family Doctors [facility]): [address], [phone]           [facility]: [address], [phone]                 [facility] ([facility]): [address], [phone] (call to ask if accepting patients) [facility]: [address], [place], [phone]    [place] Pediatricians/Family Doctors [facility] ([facility]): [address], [phone] [facility]: [address], [phone] [facility]: [address], [phone]  [place] Family Doctors  [facility] ([facility]): [phone] [facility]: [address], [phone]   [place] Family Doctors [facility]: [address], [phone]  [place] [facility]  [facility]: [address], [phone]  Home Blood Pressure Monitoring for Patients   Your provider has recommended that you check your blood pressure (BP) at least once a week at home. If you do not have a blood pressure cuff at home, one will be provided for you. Contact your provider if you have not received your monitor within 1 week.   Helpful Tips for Accurate Home Blood Pressure Checks  Don't smoke, exercise, or drink caffeine 30 minutes before checking your BP Use the restroom before checking your BP (a full bladder can raise your pressure) Relax in a comfortable upright chair Feet on the ground Left arm resting comfortably on a flat surface at the level of your heart Legs uncrossed Back supported Sit quietly and don't talk Place the cuff on your bare arm Adjust snuggly, so that only two fingertips   can fit between your skin and the top of the cuff Check 2 readings separated by at least one minute Keep a log of your BP readings For a visual, please reference this diagram: http://ccnc.care/bpdiagram  Provider Name: [facility]     Phone: [phone]  Zone 1: ALL CLEAR  Continue to monitor your symptoms:  BP reading is less than 140 (top number) or less than 90 (bottom number)  No right  upper stomach pain No headaches or seeing spots No feeling nauseated or throwing up No swelling in face and hands  Zone 2: CAUTION Call your doctor's office for any of the following:  BP reading is greater than 140 (top number) or greater than 90 (bottom number)  Stomach pain under your ribs in the middle or right side Headaches or seeing spots Feeling nauseated or throwing up Swelling in face and hands  Zone 3: EMERGENCY  Seek immediate medical care if you have any of the following:  BP reading is greater than160 (top number) or greater than 110 (bottom number) Severe headaches not improving with Tylenol Serious difficulty catching your breath Any worsening symptoms from Zone 2   Braxton Hicks Contractions Contractions of the uterus can occur throughout pregnancy, but they are not always a sign that you are in labor. You may have practice contractions called Braxton Hicks contractions. These false labor contractions are sometimes confused with true labor. What are Braxton Hicks contractions? Braxton Hicks contractions are tightening movements that occur in the muscles of the uterus before labor. Unlike true labor contractions, these contractions do not result in opening (dilation) and thinning of the cervix. Toward the end of pregnancy (32-34 weeks), Braxton Hicks contractions can happen more often and may become stronger. These contractions are sometimes difficult to tell apart from true labor because they can be very uncomfortable. You should not feel embarrassed if you go to the hospital with false labor. Sometimes, the only way to tell if you are in true labor is for your health care provider to look for changes in the cervix. The health care provider will do a physical exam and may monitor your contractions. If you are not in true labor, the exam should show that your cervix is not dilating and your water has not broken. If there are no other health problems associated with your  pregnancy, it is completely safe for you to be sent home with false labor. You may continue to have Braxton Hicks contractions until you go into true labor. How to tell the difference between true labor and false labor True labor Contractions last 30-70 seconds. Contractions become very regular. Discomfort is usually felt in the top of the uterus, and it spreads to the lower abdomen and low back. Contractions do not go away with walking. Contractions usually become more intense and increase in frequency. The cervix dilates and gets thinner. False labor Contractions are usually shorter and not as strong as true labor contractions. Contractions are usually irregular. Contractions are often felt in the front of the lower abdomen and in the groin. Contractions may go away when you walk around or change positions while lying down. Contractions get weaker and are shorter-lasting as time goes on. The cervix usually does not dilate or become thin. Follow these instructions at home:  Take over-the-counter and prescription medicines only as told by your health care provider. Keep up with your usual exercises and follow other instructions from your health care provider. Eat and drink lightly if you think   you are going into labor. If Braxton Hicks contractions are making you uncomfortable: Change your position from lying down or resting to walking, or change from walking to resting. Sit and rest in a tub of warm water. Drink enough fluid to keep your urine pale yellow. Dehydration may cause these contractions. Do slow and deep breathing several times an hour. Keep all follow-up prenatal visits as told by your health care provider. This is important. Contact a health care provider if: You have a fever. You have continuous pain in your abdomen. Get help right away if: Your contractions become stronger, more regular, and closer together. You have fluid leaking or gushing from your vagina. You pass  blood-tinged mucus (bloody show). You have bleeding from your vagina. You have low back pain that you never had before. You feel your baby's head pushing down and causing pelvic pressure. Your baby is not moving inside you as much as it used to. Summary Contractions that occur before labor are called Braxton Hicks contractions, false labor, or practice contractions. Braxton Hicks contractions are usually shorter, weaker, farther apart, and less regular than true labor contractions. True labor contractions usually become progressively stronger and regular, and they become more frequent. Manage discomfort from Braxton Hicks contractions by changing position, resting in a warm bath, drinking plenty of water, or practicing deep breathing. This information is not intended to replace advice given to you by your health care provider. Make sure you discuss any questions you have with your health care provider. Document Revised: 09/04/2017 Document Reviewed: 02/05/2017 Elsevier Patient Education  2020 [employer]   

## 2023-03-03 ENCOUNTER — Encounter: Payer: Self-pay | Admitting: Advanced Practice Midwife

## 2023-03-03 ENCOUNTER — Ambulatory Visit (INDEPENDENT_AMBULATORY_CARE_PROVIDER_SITE_OTHER): Payer: Medicaid Other | Admitting: Advanced Practice Midwife

## 2023-03-03 VITALS — BP 125/73 | HR 88 | Wt 178.0 lb

## 2023-03-03 DIAGNOSIS — Z3480 Encounter for supervision of other normal pregnancy, unspecified trimester: Secondary | ICD-10-CM

## 2023-03-03 DIAGNOSIS — Z3483 Encounter for supervision of other normal pregnancy, third trimester: Secondary | ICD-10-CM

## 2023-03-03 DIAGNOSIS — Z3A40 40 weeks gestation of pregnancy: Secondary | ICD-10-CM | POA: Diagnosis not present

## 2023-03-03 NOTE — Patient Instructions (Signed)

## 2023-03-03 NOTE — Progress Notes (Addendum)
   LOW-RISK PREGNANCY VISIT Patient name: Nichole Ramirez MRN [id number]  Date of birth: 02-26-2006 Chief Complaint:   Routine Prenatal Visit  History of Present Illness:   Nichole Ramirez is a 17 y.o. G74P0010 female at [redacted]w[redacted]d with an Estimated Date of Delivery: 03/02/23 being seen today for ongoing management of a low-risk pregnancy.  Today she reports no complaints. Contractions: Irregular.  .  Movement: Present. denies leaking of fluid. Review of Systems:   Pertinent items are noted in HPI Denies abnormal vaginal discharge w/ itching/odor/irritation, headaches, visual changes, shortness of breath, chest pain, abdominal pain, severe nausea/vomiting, or problems with urination or bowel movements unless otherwise stated above. Pertinent History Reviewed:  Reviewed past medical,surgical, social, obstetrical and family history.  Reviewed problem list, medications and allergies. Physical Assessment:   Vitals:   03/03/23 1501  BP: 125/73  Pulse: 88  Weight: 178 lb (80.7 kg)  There is no height or weight on file to calculate BMI.        Physical Examination:   General appearance: Well appearing, and in no distress  Mental status: Alert, oriented to person, place, and time  Skin: Warm & dry  Cardiovascular: Normal heart rate noted  Respiratory: Normal respiratory effort, no distress  Abdomen: Soft, gravid, nontender  Pelvic: Cervical exam performed  Dilation: 2.5 Effacement (%): 70 Station: -2  Extremities: Edema: Trace  Fetal Status: Fetal Heart Rate (bpm): 130 Fundal Height: 38 cm Movement: Present Presentation: Vertex membranes stripped  NST: FHR baseline 130 bpm, Variability: moderate, Accelerations:present, Decelerations:  Absent= Cat 1/Reactive   FH was measuring 32cms last visit, CNM had ordered US, but not scheduled. (?).  At any rate, FH is normal today, so suspect it was just positional.    Chaperone:  Peggy Dones    No results found for this or any previous visit (from the  past 24 hour(s)).  Assessment & Plan:    Pregnancy: G2P0010 at [redacted]w[redacted]d 1. Supervision of other normal pregnancy, antepartum   2. [redacted] weeks gestation of pregnancy IOL form sent in (for 6/3-6/5)     Meds: No orders of the defined types were placed in this encounter.  Labs/procedures today: NST  Plan:  Continue routine obstetrical care  Next visit: prefers will be in person for NST     Reviewed: Term labor symptoms and general obstetric precautions including but not limited to vaginal bleeding, contractions, leaking of fluid and fetal movement were reviewed in detail with the patient.  All questions were answered. Has home bp cuff.. Check bp daily;  let us know if >140/90.   Follow-up: Return in about 3 days (around 03/06/2023) for NST w/RN.  Future Appointments  Date Time Provider Department Center  03/06/2023 11:30 AM [facility] NURSE [facility]    No orders of the defined types were placed in this encounter.  Jacklyn Shell DNP, CNM 03/03/2023 3:38 PM

## 2023-03-04 ENCOUNTER — Inpatient Hospital Stay (HOSPITAL_COMMUNITY)
Admission: AD | Admit: 2023-03-04 | Discharge: 2023-03-06 | DRG: 807 | Disposition: A | Discharge status: Home / Self Care | Payer: Medicaid Other | Attending: Obstetrics and Gynecology | Admitting: Obstetrics and Gynecology

## 2023-03-04 ENCOUNTER — Inpatient Hospital Stay (HOSPITAL_COMMUNITY): Payer: Medicaid Other | Admitting: Anesthesiology

## 2023-03-04 ENCOUNTER — Other Ambulatory Visit: Payer: Self-pay

## 2023-03-04 ENCOUNTER — Encounter (HOSPITAL_COMMUNITY): Payer: Self-pay | Admitting: Obstetrics and Gynecology

## 2023-03-04 DIAGNOSIS — Z3A40 40 weeks gestation of pregnancy: Secondary | ICD-10-CM

## 2023-03-04 DIAGNOSIS — D649 Anemia, unspecified: Secondary | ICD-10-CM | POA: Diagnosis not present

## 2023-03-04 DIAGNOSIS — O99344 Other mental disorders complicating childbirth: Secondary | ICD-10-CM | POA: Diagnosis not present

## 2023-03-04 DIAGNOSIS — O480 Post-term pregnancy: Secondary | ICD-10-CM | POA: Diagnosis not present

## 2023-03-04 DIAGNOSIS — R8271 Bacteriuria: Secondary | ICD-10-CM

## 2023-03-04 DIAGNOSIS — Z3480 Encounter for supervision of other normal pregnancy, unspecified trimester: Principal | ICD-10-CM

## 2023-03-04 DIAGNOSIS — Z5986 Financial insecurity: Secondary | ICD-10-CM | POA: Diagnosis not present

## 2023-03-04 DIAGNOSIS — O9902 Anemia complicating childbirth: Secondary | ICD-10-CM | POA: Diagnosis present

## 2023-03-04 DIAGNOSIS — O99013 Anemia complicating pregnancy, third trimester: Secondary | ICD-10-CM

## 2023-03-04 DIAGNOSIS — O99820 Streptococcus B carrier state complicating pregnancy: Secondary | ICD-10-CM | POA: Diagnosis not present

## 2023-03-04 DIAGNOSIS — O99824 Streptococcus B carrier state complicating childbirth: Secondary | ICD-10-CM | POA: Diagnosis not present

## 2023-03-04 HISTORY — DX: Depression, unspecified: F32.A

## 2023-03-04 LAB — CBC
HCT: 31.9 % — ABNORMAL LOW (ref 36.0–46.0)
Hemoglobin: 10.4 g/dL — ABNORMAL LOW (ref 12.0–15.0)
MCH: 27.2 pg (ref 26.0–34.0)
MCHC: 32.6 g/dL (ref 30.0–36.0)
MCV: 83.3 fL (ref 80.0–100.0)
Platelets: 219 10*3/uL (ref 150–400)
RBC: 3.83 MIL/uL — ABNORMAL LOW (ref 3.87–5.11)
RDW: 14.5 % (ref 11.5–15.5)
WBC: 14.8 10*3/uL — ABNORMAL HIGH (ref 4.0–10.5)
nRBC: 0 % (ref 0.0–0.2)

## 2023-03-04 LAB — TYPE AND SCREEN
ABO/RH(D): O POS
Antibody Screen: NEGATIVE

## 2023-03-04 LAB — RPR: RPR Ser Ql: NONREACTIVE

## 2023-03-04 MED ORDER — WITCH HAZEL-GLYCERIN EX PADS: 1.0000 | MEDICATED_PAD | CUTANEOUS | Status: DC | PRN

## 2023-03-04 MED ORDER — OXYTOCIN-SODIUM CHLORIDE 30-0.9 UT/500ML-% IV SOLN
2.5000 [IU]/h | INTRAVENOUS | Status: DC
  Administered 2023-03-04: 2.5 [IU]/h via INTRAVENOUS
  Filled 2023-03-04: qty 500

## 2023-03-04 MED ORDER — LACTATED RINGERS IV SOLN: 500.0000 mL | INTRAVENOUS | Status: DC | PRN

## 2023-03-04 MED ORDER — SIMETHICONE 80 MG PO CHEW: 80.0000 mg | CHEWABLE_TABLET | ORAL | Status: DC | PRN

## 2023-03-04 MED ORDER — PHENYLEPHRINE 80 MCG/ML (10ML) SYRINGE FOR IV PUSH (FOR BLOOD PRESSURE SUPPORT)
80.0000 ug | PREFILLED_SYRINGE | INTRAVENOUS | Status: DC | PRN
  Filled 2023-03-04: qty 10

## 2023-03-04 MED ORDER — PRENATAL MULTIVITAMIN CH
1.0000 | ORAL_TABLET | Freq: Every day | ORAL | Status: DC
  Administered 2023-03-05 – 2023-03-06 (×2): 1 via ORAL
  Filled 2023-03-04 (×2): qty 1

## 2023-03-04 MED ORDER — ONDANSETRON HCL 4 MG PO TABS: 4.0000 mg | ORAL_TABLET | ORAL | Status: DC | PRN

## 2023-03-04 MED ORDER — IBUPROFEN 600 MG PO TABS
600.0000 mg | ORAL_TABLET | Freq: Four times a day (QID) | ORAL | Status: DC
  Administered 2023-03-04 – 2023-03-06 (×7): 600 mg via ORAL
  Filled 2023-03-04 (×7): qty 1

## 2023-03-04 MED ORDER — PHENYLEPHRINE 80 MCG/ML (10ML) SYRINGE FOR IV PUSH (FOR BLOOD PRESSURE SUPPORT): 80.0000 ug | PREFILLED_SYRINGE | INTRAVENOUS | Status: DC | PRN

## 2023-03-04 MED ORDER — DIPHENHYDRAMINE HCL 25 MG PO CAPS: 25.0000 mg | ORAL_CAPSULE | Freq: Four times a day (QID) | ORAL | Status: DC | PRN

## 2023-03-04 MED ORDER — BENZOCAINE-MENTHOL 20-0.5 % EX AERO
1.0000 | INHALATION_SPRAY | CUTANEOUS | Status: DC | PRN
  Administered 2023-03-05: 1 via TOPICAL
  Filled 2023-03-04: qty 56

## 2023-03-04 MED ORDER — ZOLPIDEM TARTRATE 5 MG PO TABS: 5.0000 mg | ORAL_TABLET | Freq: Every evening | ORAL | Status: DC | PRN

## 2023-03-04 MED ORDER — LACTATED RINGERS IV SOLN: INTRAVENOUS | Status: DC

## 2023-03-04 MED ORDER — ACETAMINOPHEN 325 MG PO TABS: 650.0000 mg | ORAL_TABLET | ORAL | Status: DC | PRN

## 2023-03-04 MED ORDER — TETANUS-DIPHTH-ACELL PERTUSSIS 5-2.5-18.5 LF-MCG/0.5 IM SUSY: 0.5000 mL | PREFILLED_SYRINGE | Freq: Once | INTRAMUSCULAR | Status: DC

## 2023-03-04 MED ORDER — ONDANSETRON HCL 4 MG/2ML IJ SOLN
4.0000 mg | Freq: Four times a day (QID) | INTRAMUSCULAR | Status: DC | PRN
  Administered 2023-03-04: 4 mg via INTRAVENOUS
  Filled 2023-03-04: qty 2

## 2023-03-04 MED ORDER — SOD CITRATE-CITRIC ACID 500-334 MG/5ML PO SOLN: 30.0000 mL | ORAL | Status: DC | PRN

## 2023-03-04 MED ORDER — LIDOCAINE-EPINEPHRINE (PF) 2 %-1:200000 IJ SOLN
INTRAMUSCULAR | Status: DC | PRN
  Administered 2023-03-04: 3 mL via EPIDURAL

## 2023-03-04 MED ORDER — FENTANYL CITRATE (PF) 100 MCG/2ML IJ SOLN
50.0000 ug | INTRAMUSCULAR | Status: DC | PRN
  Administered 2023-03-04: 50 ug via INTRAVENOUS
  Filled 2023-03-04: qty 2

## 2023-03-04 MED ORDER — COCONUT OIL OIL: 1.0000 | TOPICAL_OIL | Status: DC | PRN

## 2023-03-04 MED ORDER — EPHEDRINE 5 MG/ML INJ: 10.0000 mg | INTRAVENOUS | Status: DC | PRN

## 2023-03-04 MED ORDER — DIBUCAINE (PERIANAL) 1 % EX OINT: 1.0000 | TOPICAL_OINTMENT | CUTANEOUS | Status: DC | PRN

## 2023-03-04 MED ORDER — SODIUM CHLORIDE 0.9 % IV SOLN
5.0000 10*6.[IU] | Freq: Once | INTRAVENOUS | Status: AC
  Administered 2023-03-04: 5 10*6.[IU] via INTRAVENOUS
  Filled 2023-03-04: qty 5

## 2023-03-04 MED ORDER — PENICILLIN G POT IN DEXTROSE 60000 UNIT/ML IV SOLN
3.0000 10*6.[IU] | INTRAVENOUS | Status: DC
  Administered 2023-03-04: 3 10*6.[IU] via INTRAVENOUS
  Filled 2023-03-04: qty 50

## 2023-03-04 MED ORDER — SOD CITRATE-CITRIC ACID 500-334 MG/5ML PO SOLN
ORAL | Status: AC
  Filled 2023-03-04: qty 30

## 2023-03-04 MED ORDER — ONDANSETRON HCL 4 MG/2ML IJ SOLN: 4.0000 mg | INTRAMUSCULAR | Status: DC | PRN

## 2023-03-04 MED ORDER — DIPHENHYDRAMINE HCL 50 MG/ML IJ SOLN: 12.5000 mg | INTRAMUSCULAR | Status: DC | PRN

## 2023-03-04 MED ORDER — LACTATED RINGERS IV SOLN: 500.0000 mL | Freq: Once | INTRAVENOUS | Status: DC

## 2023-03-04 MED ORDER — LIDOCAINE HCL (PF) 1 % IJ SOLN: 30.0000 mL | INTRAMUSCULAR | Status: DC | PRN

## 2023-03-04 MED ORDER — SENNOSIDES-DOCUSATE SODIUM 8.6-50 MG PO TABS
2.0000 | ORAL_TABLET | Freq: Every day | ORAL | Status: DC
  Administered 2023-03-05 – 2023-03-06 (×2): 2 via ORAL
  Filled 2023-03-04 (×2): qty 2

## 2023-03-04 MED ORDER — OXYTOCIN BOLUS FROM INFUSION
333.0000 mL | Freq: Once | INTRAVENOUS | Status: AC
  Administered 2023-03-04: 333 mL via INTRAVENOUS

## 2023-03-04 MED ORDER — BUPIVACAINE HCL (PF) 0.25 % IJ SOLN
INTRAMUSCULAR | Status: DC | PRN
  Administered 2023-03-04 (×2): 4 mL via EPIDURAL

## 2023-03-04 MED ORDER — FENTANYL-BUPIVACAINE-NACL 0.5-0.125-0.9 MG/250ML-% EP SOLN
12.0000 mL/h | EPIDURAL | Status: DC | PRN
  Administered 2023-03-04: 12 mL/h via EPIDURAL
  Filled 2023-03-04: qty 250

## 2023-03-04 NOTE — Progress Notes (Signed)
Nichole Ramirez is a 17 y.o. G2P0010 at [redacted]w[redacted]d by ultrasound admitted for active labor.  Subjective: - not complaining of contraction pain at this time - well supported by partner and family  Objective: BP 130/78   Pulse 93   Temp 98 F (36.7 C) (Oral)   Resp 16   Ht 5\' 7"  (1.702 m)   Wt 81.5 kg   SpO2 100%   BMI 28.13 kg/m  No intake/output data recorded. No intake/output data recorded.  FHT:  125 bpm, moderate variability, +15x15 accels, +variable decels UC: Q 3-30mins  SVE:   Dilation: 10 Effacement (%): 100 Station: Plus 2 Exam by:: [employer] RN  Labs: Lab Results  Component Value Date   WBC 14.8 (H) 03/04/2023   HGB 10.4 (L) 03/04/2023   HCT 31.9 (L) 03/04/2023   MCV 83.3 03/04/2023   PLT 219 03/04/2023    Assessment / Plan: Augmentation of labor, progressing well;   Labor: labor down Preeclampsia:   none Fetal Wellbeing:  Category II; overall reactive and reassuring with moderate variability; has occasional variable deceleration Pain Control:  Epidural I/D:   GBS positive, receiving antibiotics q 4hrs Anticipated MOD:  NSVD  Nichole Ramirez, CNM 03/04/2023, 4:22 PM

## 2023-03-04 NOTE — Discharge Summary (Signed)
Postpartum Discharge Summary     Patient Name: Nichole Ramirez DOB: 05/16/2006 MRN: [id number]  Date of admission: 03/04/2023 Delivery date:03/04/2023  Delivering provider: Brand Males  Date of discharge: 03/06/2023  Admitting diagnosis: Indication for care in labor and delivery, antepartum [O75.9] Intrauterine pregnancy: [redacted]w[redacted]d     Secondary diagnosis:  Active Problems:   SVD (spontaneous vaginal delivery)  Additional problems: n/a    Discharge diagnosis: Term Pregnancy Delivered                                              Post partum procedures: depo Augmentation: AROM Complications: None  Hospital course: Onset of Labor With Vaginal Delivery      17 y.o. yo G2P0010 at [redacted]w[redacted]d was admitted in Latent Labor on 03/04/2023. Labor course was complicated by none  Membrane Rupture Time/Date: 1:15 PM ,03/04/2023   Delivery Method:Vaginal, Spontaneous  Episiotomy: None  Lacerations:  2nd degree;Labial  Patient had a postpartum course was uncomplicated.  She is ambulating, tolerating a regular diet, passing flatus, and urinating well. Patient is discharged home in stable condition on 03/06/23.  Newborn Data: Birth date:03/04/2023  Birth time:4:57 PM  Gender:Female  Living status:Living  Apgars:8 ,9  Weight:3232 g   Magnesium Sulfate received: No BMZ received: No Rhophylac:N/A MMR:Yes T-DaP:Given prenatally Flu: N/A Transfusion:No  Physical exam  Vitals:   03/05/23 0557 03/05/23 1410 03/06/23 0030 03/06/23 0610  BP: 126/69 115/68 129/64 (!) 113/57  Pulse: 72 63 72 85  Resp: 18 17 18 18   Temp: 98.1 F (36.7 C) 98.2 F (36.8 C) 98 F (36.7 C) 98.3 F (36.8 C)  TempSrc: Oral Oral Oral Oral  SpO2:   99% 100%  Weight:      Height:       General: alert, cooperative, and no distress Lochia: appropriate Uterine Fundus: firm Incision: N/A DVT Evaluation: No evidence of DVT seen on physical exam. Labs: Lab Results  Component Value Date   WBC 14.8 (H) 03/04/2023    HGB 10.4 (L) 03/04/2023   HCT 31.9 (L) 03/04/2023   MCV 83.3 03/04/2023   PLT 219 03/04/2023      Latest Ref Rng & Units 02/22/2016    3:38 PM  CMP  Glucose 65 - 99 mg/dL 76   BUN 7 - 20 mg/dL 12   Creatinine 1.19 - 0.78 mg/dL 1.47   Sodium 829 - 562 mmol/L 138   Potassium 3.8 - 5.1 mmol/L 4.5   Chloride 98 - 110 mmol/L 106   CO2 20 - 31 mmol/L 24   Calcium 8.9 - 10.4 mg/dL 9.6   Total Protein 6.3 - 8.2 g/dL 6.5   Total Bilirubin 0.2 - 1.1 mg/dL 0.3   Alkaline Phos 130 - 471 U/L 239   AST 12 - 32 U/L 17   ALT 8 - 24 U/L 7    Edinburgh Score:    03/05/2023    1:56 PM  Edinburgh Postnatal Depression Scale Screening Tool  I have been able to laugh and see the funny side of things. 0  I have looked forward with enjoyment to things. 0  I have blamed myself unnecessarily when things went wrong. 2  I have been anxious or worried for no good reason. 2  I have felt scared or panicky for no good reason. 1  Things have been getting on top  of me. 2  I have been so unhappy that I have had difficulty sleeping. 1  I have felt sad or miserable. 1  I have been so unhappy that I have been crying. 1  The thought of harming myself has occurred to me. 0  Edinburgh Postnatal Depression Scale Total 10     After visit meds:  Allergies as of 03/06/2023   No Known Allergies      Medication List     STOP taking these medications    promethazine 25 MG suppository Commonly known as: PHENERGAN       TAKE these medications    acetaminophen 325 MG tablet Commonly known as: Tylenol Take 2 tablets (650 mg total) by mouth every 4 (four) hours as needed (for pain scale < 4).   Ferrous Fumarate 150 MG Tabs Take 1 tablet (150 mg total) by mouth every other day.   ibuprofen 600 MG tablet Commonly known as: ADVIL Take 1 tablet (600 mg total) by mouth every 6 (six) hours.   Prenatal Vitamin 27-0.8 MG Tabs Take 1 tablet by mouth daily.         Discharge home in stable  condition Infant Feeding: Breast Infant Disposition:home with mother Discharge instruction: per After Visit Summary and Postpartum booklet. Activity: Advance as tolerated. Pelvic rest for 6 weeks.  Diet: routine diet Future Appointments: Future Appointments  Date Time Provider Department Center  04/13/2023 10:10 AM Cheral Marker, CNM [facility]   Follow up Visit:  Message sent to [facility] on 5/29  Please schedule this patient for a In person postpartum visit in 6 weeks with the following provider: Any provider. Additional Postpartum F/U: n/a   Low risk pregnancy complicated by: none Delivery mode:  Vaginal, Spontaneous  Anticipated Birth Control:  Depo   03/06/2023 Randa Evens Autry-Lott, DO

## 2023-03-04 NOTE — MAU Note (Signed)
Pt says UC's strong since 0400 [facility]  VE- yesterday- stripped membranes  3 cm Denies HSV GBS- positive

## 2023-03-04 NOTE — Progress Notes (Addendum)
Nichole Ramirez is a 17 y.o. G2P0010 at [redacted]w[redacted]d by ultrasound admitted for active labor.  Subjective: - not complaining of contraction pain at this time - well supported by partner and family  Objective: BP 117/73   Pulse 82   Temp 98 F (36.7 C) (Oral)   Resp 16   Ht 5\' 7"  (1.702 m)   Wt 81.5 kg   SpO2 100%   BMI 28.13 kg/m  No intake/output data recorded. No intake/output data recorded.  FHT:  FHR: 125 bpm, variability: moderate,  accelerations:  Present,  decelerations:  Absent UC:   irregular, every 3-5 minutes SVE:   Dilation: 5 Effacement (%): 100 Station: -2 Exam by:: lee AROM @1315  moderate amount of clear fluid  Labs: Lab Results  Component Value Date   WBC 14.8 (H) 03/04/2023   HGB 10.4 (L) 03/04/2023   HCT 31.9 (L) 03/04/2023   MCV 83.3 03/04/2023   PLT 219 03/04/2023    Assessment / Plan: Augmentation of labor, progressing well; Through shared decision making, agreed to AROM to augment labor. Expectant management, pitocin as needed.  Labor: Progressing normally Preeclampsia:   none Fetal Wellbeing:  Category I Pain Control:  Labor support without medications I/D:   GBS positive, receiving antibiotics q 4hrs Anticipated MOD:  NSVD  Karis Juba, Student-MidWife 03/04/2023, 1:17 PM  Attestation of CNM Supervision of Midwife Student: Evaluation and management procedures were performed by the midwife student under my supervision. I was immediately available for direct supervision, assistance and direction throughout this encounter.  I also confirm that I have verified the information documented in the resident's note, and that I have also personally reperformed the pertinent components of the physical exam and all of the medical decision making activities.  I have also made any necessary editorial changes.  AROM with moderate amount of clear fluid. Patient and fetus tolerated well.  Brand Males, CNM 03/04/2023 1:26 PM

## 2023-03-04 NOTE — Anesthesia Preprocedure Evaluation (Signed)
Anesthesia Evaluation  Patient identified by MRN, date of birth, ID band Patient awake    Reviewed: Allergy & Precautions, NPO status , Patient's Chart, lab work & pertinent test results  History of Anesthesia Complications Negative for: history of anesthetic complications  Airway Mallampati: I  TM Distance: >3 FB Neck ROM: Full    Dental  (+) Dental Advisory Given   Pulmonary neg pulmonary ROS   breath sounds clear to auscultation       Cardiovascular negative cardio ROS  Rhythm:Regular     Neuro/Psych  Headaches    GI/Hepatic negative GI ROS, Neg liver ROS,,,  Endo/Other    Renal/GU negative Renal ROS     Musculoskeletal negative musculoskeletal ROS (+)    Abdominal   Peds  Hematology  (+) Blood dyscrasia, anemia Lab Results      Component                Value               Date                      WBC                      14.8 (H)            03/04/2023                HGB                      10.4 (L)            03/04/2023                HCT                      31.9 (L)            03/04/2023                MCV                      83.3                03/04/2023                PLT                      219                 03/04/2023             Denies blood thinners   Anesthesia Other Findings   Reproductive/Obstetrics (+) Pregnancy                             Anesthesia Physical Anesthesia Plan  ASA: 2  Anesthesia Plan: Epidural   Post-op Pain Management:    Induction:   PONV Risk Score and Plan: 2 and Treatment may vary due to age or medical condition  Airway Management Planned: Natural Airway  Additional Equipment: None  Intra-op Plan:   Post-operative Plan:   Informed Consent: I have reviewed the patients History and Physical, chart, labs and discussed the procedure including the risks, benefits and alternatives for the proposed anesthesia with the patient or  authorized representative who has indicated his/her understanding and acceptance.  Plan Discussed with: Anesthesiologist  Anesthesia Plan Comments:        Anesthesia Quick Evaluation

## 2023-03-04 NOTE — Anesthesia Procedure Notes (Signed)
Epidural Patient location during procedure: OB Start time: 03/04/2023 9:09 AM End time: 03/04/2023 9:27 AM  Staffing Anesthesiologist: Val Eagle, MD Performed: anesthesiologist   Preanesthetic Checklist Completed: patient identified, IV checked, risks and benefits discussed, monitors and equipment checked, pre-op evaluation and timeout performed  Epidural Patient position: sitting Prep: DuraPrep Patient monitoring: heart rate, continuous pulse ox and blood pressure Approach: midline Location: L4-L5 Injection technique: LOR saline  Needle:  Needle type: Tuohy  Needle gauge: 17 G Needle length: 9 cm Needle insertion depth: 6 cm Catheter type: closed end flexible Catheter size: 19 Gauge Catheter at skin depth: 12 cm Test dose: negative and 2% lidocaine with Epi 1:200 K  Assessment Events: blood not aspirated, no cerebrospinal fluid, injection not painful, no injection resistance, no paresthesia and negative IV test

## 2023-03-04 NOTE — H&P (Signed)
OBSTETRIC ADMISSION HISTORY AND PHYSICAL  Nichole Ramirez is a 17 y.o. female G2P0010 with IUP at [redacted]w[redacted]d by U/S  presenting for labor. She reports +FMs, No LOF, no VB, no blurry vision, headaches or peripheral edema, and RUQ pain.  She plans on breast feeding. She request depo for birth control. She received her prenatal care at [facility]   Dating: By 9 wk U/S --->  Estimated Date of Delivery: 03/02/23  Sono:    @[redacted]w[redacted]d , CWD, normal anatomy, cephalic presentation, 1884g, 16% EFW   Prenatal History/Complications:  --Teen pregnancy --GBS bacteruria --Anemia in pregnancy    Past Medical History: Past Medical History:  Diagnosis Date   ADHD (attention deficit hyperactivity disorder)    Headache     Past Surgical History: History reviewed. No pertinent surgical history.  Obstetrical History: OB History     Gravida  2   Para  0   Term  0   Preterm  0   AB  1   Living  0      SAB  0   IAB  0   Ectopic  0   Multiple  0   Live Births  0           Social History Social History   Socioeconomic History   Marital status: Single    Spouse name: Not on file   Number of children: Not on file   Years of education: Not on file   Highest education level: Not on file  Occupational History   Not on file  Tobacco Use   Smoking status: Never   Smokeless tobacco: Never  Vaping Use   Vaping Use: Never used  Substance and Sexual Activity   Alcohol use: Not Currently    Comment: occ   Drug use: Not Currently    Types: Marijuana    Comment: last smoked today at 0330   Sexual activity: Yes  Other Topics Concern   Not on file  Social History Narrative   Lives with mother    Social Determinants of Health   Financial Resource Strain: Medium Risk (08/20/2022)   Overall Financial Resource Strain (CARDIA)    Difficulty of Paying Living Expenses: Somewhat hard  Food Insecurity: Food Insecurity Present (08/20/2022)   Hunger Vital Sign    Worried About Running  Out of Food in the Last Year: Sometimes true    Ran Out of Food in the Last Year: Never true  Transportation Needs: No Transportation Needs (08/20/2022)   PRAPARE - Administrator, Civil Service (Medical): No    Lack of Transportation (Non-Medical): No  Physical Activity: Insufficiently Active (08/20/2022)   Exercise Vital Sign    Days of Exercise per Week: 3 days    Minutes of Exercise per Session: 20 min  Stress: No Stress Concern Present (08/20/2022)   [employer] of Occupational Health - Occupational Stress Questionnaire    Feeling of Stress : Only a little  Recent Concern: Stress - Stress Concern Present (07/29/2022)   [employer] of Occupational Health - Occupational Stress Questionnaire    Feeling of Stress : To some extent  Social Connections: Moderately Integrated (08/20/2022)   Social Connection and Isolation Panel [NHANES]    Frequency of Communication with Friends and Family: Twice a week    Frequency of Social Gatherings with Friends and Family: Once a week    Attends Religious Services: 1 to 4 times per year    Active Member of [employer] or Organizations:  No    Attends Banker Meetings: Never    Marital Status: Living with partner  Recent Concern: Social Connections - Moderately Isolated (07/29/2022)   Social Connection and Isolation Panel [NHANES]    Frequency of Communication with Friends and Family: Once a week    Frequency of Social Gatherings with Friends and Family: Twice a week    Attends Religious Services: 1 to 4 times per year    Active Member of [employer] or Organizations: No    Attends Engineer, structural: Never    Marital Status: Never married    Family History: Family History  Problem Relation Age of Onset   Hypertension Mother    ADD / ADHD Mother    Depression Mother    Diabetes Mother    Hypertension Father    Arthritis Father    ADD / ADHD Maternal Uncle    Thyroid disease Maternal Grandmother     Diabetes Maternal Grandmother    Depression Maternal Grandmother    Hypertension Maternal Grandmother    ADD / ADHD Maternal Grandmother    Breast cancer Maternal Grandmother    Heart disease Maternal Grandfather    Diabetes Maternal Grandfather    Macular degeneration Maternal Grandfather    Arthritis Maternal Grandfather    Drug abuse Paternal Grandmother        overdose   ADD / ADHD Cousin    Cancer Neg Hx     Allergies: No Known Allergies  Medications Prior to Admission  Medication Sig Dispense Refill Last Dose   Ferrous Fumarate 150 MG TABS Take 1 tablet (150 mg total) by mouth every other day. 30 tablet 4 Past Week   Prenatal Vit-Fe Fumarate-FA (PRENATAL VITAMIN) 27-0.8 MG TABS Take 1 tablet by mouth daily. 90 tablet 3 Past Week   promethazine (PHENERGAN) 25 MG suppository Place 1 suppository (25 mg total) rectally every 6 (six) hours as needed for nausea or vomiting. 12 each 2 More than a month     Review of Systems   All systems reviewed and negative except as stated in HPI  Blood pressure (!) 143/67, pulse 80, temperature 97.9 F (36.6 C), temperature source Oral, resp. rate 16, height 5\' 7"  (1.702 m), weight 81.5 kg. General appearance: alert and anxious Lungs: normal effort  Heart: regular rate noted Abdomen:gravid Extremities: No LE edema Presentation: cephalic Fetal monitoringBaseline: 125 bpm/mod variability/+accels/no decels Uterine activityFrequency: Every 2-3 minutes Dilation: 4.5 Effacement (%): 70 Station: -2 Exam by:: DCallaway, RN   Prenatal labs: ABO, Rh: O/Positive/-- (11/15 1353) Antibody: Negative (03/06 0941) Rubella: 2.40 (11/15 1353) RPR: Non Reactive (03/06 0941)  HBsAg: Negative (11/15 1353)  HIV: Non Reactive (03/06 0941)  GBS:   +urine 1 hr Glucola 147, 82, 129 Genetic screening  NT/IT: neg/neg  Panorama: low risk female  Anatomy US WNL  Prenatal Transfer Tool  Maternal Diabetes: No Genetic Screening: Normal Maternal  Ultrasounds/Referrals: Normal Fetal Ultrasounds or other Referrals:  None Maternal Substance Abuse:  No Significant Maternal Medications:  None Significant Maternal Lab Results:  Group B Strep positive Number of Prenatal Visits:greater than 3 verified prenatal visits Other Comments:  N/A  No results found for this or any previous visit (from the past 24 hour(s)).  Patient Active Problem List   Diagnosis Date Noted   Anemia affecting pregnancy 12/13/2022   Group B streptococcal bacteriuria 08/22/2022   Encounter for supervision of normal pregnancy, antepartum 07/29/2022   Marijuana smoker 01/04/2021   ADD (attention deficit disorder) without  hyperactivity 12/25/2016    Assessment/Plan:  Nichole Ramirez is a 17 y.o. G2P0010 at [redacted]w[redacted]d here for labor  #Labor: Recurrent painful contractions, assess for spontaneous change #Pain: Requesting IV pain medicine #FWB: Cat I #ID:  GBS+ intrapartum PCN ppx #MOF: breast #MOC: depo  Clayton Bibles, MSA, MSN, CNM Certified Nurse Midwife, Biochemist, clinical for [employer], [facility]

## 2023-03-04 NOTE — Lactation Note (Signed)
This note was copied from a baby's chart. Lactation Consultation Note  Patient Name: Nichole Ramirez [id number] Date: 03/04/2023 Age:19 hours Reason for consult: Initial assessment;1st time breastfeeding;Term. See Birth Parent's MR- hx Anemia  Per Birth Parent, she feels breastfeeding is going well and she doesn't have any questions for the LC at this time, infant has BF 4 times since birth. LC did not observe latch infant asleep in basinet and Per Birth Parent infant recently BF for 10 minutes at 2130 pm. LC discussed hand expression using breast model. Birth Parent will continue to BF infant by cues, on demand, 8 to 12+ times within 24 hours, STS. Birth Parent knows to call RN/LC if their are any BF questions, concerns or if she need latch assistance. LC discussed infant's input and output. LC discussed importance of maternal rest, diet and hydration. Birth Parent was  made aware of O/P services, breastfeeding support groups, community resources, and our phone # for post-discharge questions.    Maternal Data Has patient been taught Hand Expression?: Yes Does the patient have breastfeeding experience prior to this delivery?: No  Feeding Mother's Current Feeding Choice: Breast Milk and Formula  LATCH Score Latch: Grasps breast easily, tongue down, lips flanged, rhythmical sucking.  Audible Swallowing: A few with stimulation  Type of Nipple: Everted at rest and after stimulation  Comfort (Breast/Nipple): Soft / non-tender  Hold (Positioning): Assistance needed to correctly position infant at breast and maintain latch.  LATCH Score: 8   Lactation Tools Discussed/Used    Interventions Interventions: Breast feeding basics reviewed;Skin to skin;Position options;Hand express;Breast compression;LC Services brochure  Discharge Pump: DEBP;Personal  Consult Status Consult Status: Follow-up Date: 03/05/23 Follow-up type: In-patient    Frederico Hamman 03/04/2023, 10:13 PM

## 2023-03-05 NOTE — Lactation Note (Signed)
This note was copied from a baby's chart. Lactation Consultation Note  Patient Name: Nichole Ramirez [id number] Date: 03/05/2023 Age:19 hours Reason for consult: Follow-up assessment;Difficult latch;Primapara;1st time breastfeeding;Term;Infant weight loss;Breastfeeding assistance (1.92% WL) The infant was at 9 hours old.  LC entered the room and the NT was assisting the birth parent with hand expressing milk to feed to the infant.  The NT collected 3mL that was spoon fed to the infant.  LC assisted the birth parent with latching the infant to the right breast in the cross-cradle position.  The infant latched and a clicking noise was noted.  LC tugged down on the infant's chin to open up the latch and encouraged the birth parent to bring the infant closer to the breast.  The clicking was silenced.  The was latched deeply with her lips flanged, sucking was rhythmic, and audible swallows were noted.  The birth parent stated that the latch was more comfortable.  The parents had no further questions or concerns.    Feeding Mother's Current Feeding Choice: Breast Milk  LATCH Score Latch: Grasps breast easily, tongue down, lips flanged, rhythmical sucking.  Audible Swallowing: Spontaneous and intermittent  Type of Nipple: Flat (The birth parent's nipples are short shafted)  Comfort (Breast/Nipple): Soft / non-tender  Hold (Positioning): Assistance needed to correctly position infant at breast and maintain latch.  LATCH Score: 8  Interventions Interventions: Assisted with latch;Adjust position;Support pillows;Education  Consult Status Consult Status: Follow-up Date: 03/06/23 Follow-up type: In-patient   [name] 03/05/2023, 10:56 AM

## 2023-03-05 NOTE — Progress Notes (Signed)
POSTPARTUM PROGRESS NOTE  Post Partum Day 1  Subjective:  SARAJANE NGUON is a 17 y.o. G2P1011 s/p SVD at [redacted]w[redacted]d.  She reports she is doing well. No acute events overnight. She denies any problems with ambulating, voiding or po intake. Denies nausea or vomiting.  Pain is well controlled.  Lochia is appropriate.  Objective: Blood pressure 126/69, pulse 72, temperature 98.1 F (36.7 C), temperature source Oral, resp. rate 18, height 5\' 7"  (1.702 m), weight 81.5 kg, SpO2 99 %, unknown if currently breastfeeding.  Physical Exam:  General: alert, cooperative and no distress Chest: no respiratory distress Heart:regular rate, distal pulses intact Abdomen: soft, nontender,  Uterine Fundus: firm, appropriately tender DVT Evaluation: No calf swelling or tenderness Extremities: No edema Skin: warm, dry  Recent Labs    03/04/23 0635  HGB 10.4*  HCT 31.9*    Assessment/Plan: BREXLYN OTTEY is a 17 y.o. G2P1011 s/p SVD at [redacted]w[redacted]d   PPD#1 - Doing well  Routine postpartum care  Contraception: Depo Feeding: breast and bottle  Dispo: Plan for discharge tomorrow.   LOS: 1 day   Kristie Cowman, MS3 03/05/2023, 6:50 AM ___ Flonnie Hailstone of Supervision of Student:  I confirm that I have verified the information documented in the medical student's note and that I have also personally performed the history, physical exam and all medical decision making activities.  I have verified that all services and findings are accurately documented in this student's note; and I agree with management and plan as outlined in the documentation. I have also made any necessary editorial changes.  Myrtie Hawk, DO [facility] [employer], [facility] 03/05/2023 8:22 AM

## 2023-03-05 NOTE — Clinical Social Work Maternal (Addendum)
CLINICAL SOCIAL WORK MATERNAL/CHILD NOTE  Patient Details  Name: Nichole Ramirez MRN: [id number] Date of Birth: 20-Feb-2006  Date:  03/05/2023  Clinical Social Worker Initiating Note:  Nichole Ramirez Date/Time: Initiated:  03/05/23/1427     Child's Name:  Nichole Ramirez   Biological Parents:  Mother, Father Nichole Ramirez and Nichole Ramirez)   Need for Interpreter:  None   Reason for Referral:  Current Substance Use/Substance Use During Pregnancy     Address:  [address] [place] [address]    Phone number:  [phone] (home)     Additional phone number:   Household Members/Support Persons (HM/SP):   Household Member/Support Person 1, Household Member/Support Person 2   HM/SP Name Relationship DOB or Age  HM/SP -1 Nichole Ramirez FOB 02-17-2002  HM/SP -2 Nichole Ramirez [name]    HM/SP -3        HM/SP -4        HM/SP -5        HM/SP -6        HM/SP -7        HM/SP -8          Natural Supports (not living in the home):  Immediate Family, Extended Family   Professional Supports: None   Employment: Consulting civil engineer   Type of Work:     Education:  Engineer, agricultural   Homebound arranged:    Surveyor, quantity Resources:  Medicaid   Other Resources:  [facility]   Cultural/Religious Considerations Which May Impact Care:    Strengths:  Ability to meet basic needs  , Merchandiser, retail, Home prepared for child     Psychotropic Medications:         Pediatrician:    Nichole Ramirez  Pediatrician List:   [facility]    [facility]  [facility]      Pediatrician Fax Number:    Risk Factors/Current Problems:  Substance Use     Cognitive State:  Able to Concentrate  , Alert  , Insightful  , Goal Oriented  , Linear Thinking     Mood/Affect:  Interested  , Comfortable  , Relaxed  , Happy  , Calm  , Bright     CSW Assessment: CSW received a consult for substance use during  pregnancy. CSW met [name] at bedside to complete mental health assessment. CSW entered room, introduced herself and asked [name] for privacy reasons could guest step out for the assessment; [name] agreed and guest stepped out. CSW explained her role and the reason for the visit. [name] was polite, easy to engage, receptive to meeting with CSW, and appeared forthcoming.  CSW acknowledged [place] score of 10 and listened to [name] explore her feelings about transitioning into motherhood. CSW collected [name]'s demographic information and inquired about her mental health history. [name] reported being diagnosed with anxiety, depression and ADHD around the 8th and 9th grade due to social experiences. [name] reported being prescribed several different medications for support; however she stopped them due to pregnancy and has participated in therapy in the past; however she had to stop attending due to financial issues. CSW provided resources to [facility] for therapist that accepts medicaid insured patients. [name] reported currently feeling "good" and bonded well with infant. CSW provided education regarding the baby blues period vs. perinatal mood disorders, discussed treatment and gave resources for mental health follow up if concerns arise.  CSW recommends  self-evaluation during the postpartum time period using the New Mom Checklist from Postpartum Progress and encouraged [name] to contact a medical professional if symptoms are noted at any time. CSW assessed for safety with [name] SI/HI/DV;[name] denied all.  CSW asked [name] does she receive support resources; [name] said yes to receiving WIC and denied the support of food stamps. [name] reported having all essential items for infant including a carseat and bassinet for safe sleeping. CSW provided review of Sudden Infant Death Syndrome (SIDS) precautions.   CSW informed [name] with the use of THC during pregnancy the hospital will perform a UDS and CDS on infant. If the screenings return with positive results  a report to CPS will be made; [name] was understanding. [name] reported her last use of THC, was a few hours prior to giving birth; and used about 3 times week for sickness and pain symptoms during pregnancy. [name] denied the use of illicit drugs.   CSW made CPS report due to positive THC urine and cord screenings with [employer].  CSW Plan/Description:  No Further Intervention Required/No Barriers to Discharge, Sudden Infant Death Syndrome (SIDS) Education, Perinatal Mood and Anxiety Disorder (PMADs) Education, Hospital Drug Screen Policy Information, Other Information/Referral to [facility], CSW Will Continue to Monitor Umbilical Cord Tissue and Urine Drug Screen Results and Make Report if Joanie Coddington, LCSW 03/05/2023, 2:30 PM

## 2023-03-05 NOTE — Anesthesia Postprocedure Evaluation (Signed)
Anesthesia Post Note  Patient: Nichole Ramirez  Procedure(s) Performed: AN AD HOC LABOR EPIDURAL     Patient location during evaluation: Mother Baby Anesthesia Type: Epidural Level of consciousness: oriented and awake and alert Pain management: pain level controlled Vital Signs Assessment: post-procedure vital signs reviewed and stable Respiratory status: spontaneous breathing and respiratory function stable Cardiovascular status: blood pressure returned to baseline and stable Postop Assessment: no headache, no backache, no apparent nausea or vomiting and able to ambulate Anesthetic complications: no   No notable events documented.  Last Vitals:  Vitals:   03/04/23 2349 03/05/23 0557  BP: (!) 115/59 126/69  Pulse: 83 72  Resp: 18 18  Temp: 37.3 C 36.7 C  SpO2:      Last Pain:  Vitals:   03/05/23 1356  TempSrc:   PainSc: 3    Pain Goal:                   Lorrene Reid

## 2023-03-06 ENCOUNTER — Other Ambulatory Visit: Payer: Medicaid Other

## 2023-03-06 LAB — BIRTH TISSUE RECOVERY COLLECTION (PLACENTA DONATION)

## 2023-03-06 MED ORDER — ACETAMINOPHEN 325 MG PO TABS: 650.0000 mg | ORAL_TABLET | ORAL | 0 refills | Status: DC | PRN

## 2023-03-06 MED ORDER — MEDROXYPROGESTERONE ACETATE 150 MG/ML IM SUSP
150.0000 mg | Freq: Once | INTRAMUSCULAR | Status: AC
  Administered 2023-03-06: 150 mg via INTRAMUSCULAR
  Filled 2023-03-06: qty 1

## 2023-03-06 MED ORDER — IBUPROFEN 600 MG PO TABS: 600.0000 mg | ORAL_TABLET | Freq: Four times a day (QID) | ORAL | 0 refills | Status: DC

## 2023-03-06 NOTE — Lactation Note (Signed)
This note was copied from a baby's chart. Lactation Consultation Note  Patient Name: Nichole Ramirez [id number] Date: 03/06/2023 Age:19 hours  Reason for consult: Follow-up assessment;Primapara;1st time breastfeeding;Term;Breastfeeding assistance;Mother's request  P1, GA [redacted]w[redacted]d, 5% weight loss  Upon entry to room, baby was in mom's lap, alert and showing feeding cues. Mother receptive to [facility] assist with latching. Mother hand expressed and colostrum present. Basic breastfeeding education and infant latched well in cross cradle hold. Initial latch, "clicking" was heard, slight readjustment of the lower lip and jaw corrected the "click". Infant's gape was wide, tugs felt by mother and audible swallows heard.   Instructed mother to feed baby with feeding cues with the goal to breast feed 8-12 times in 24 hours. Discussed not exceeding 3 hours for a feeding. Mother taught how to keep baby engaged, watch for swallows and breast compression to aid in milk transfer with breastfeeding. Parents verbalized understanding. Educated about supply and demand for establishing and maintaining milk supply.  Parents were also instructed that if baby is not latching or mother is unable to breastfeed to give baby expressed milk or formula. Feeding guidelines given. EBM and formula  storage and preparation guidelines given due to mother currently breast and formula feeding.   Mom made aware of O/P services, breastfeeding support groups, and our phone # for post-discharge questions.      Feeding Mother's Current Feeding Choice: Breast Milk and Formula Nipple Type: Slow - flow  LATCH Score Latch: Grasps breast easily, tongue down, lips flanged, rhythmical sucking.  Audible Swallowing: Spontaneous and intermittent  Type of Nipple: Everted at rest and after stimulation  Comfort (Breast/Nipple): Soft / non-tender  Hold (Positioning): Assistance needed to correctly position infant at breast and maintain  latch.  LATCH Score: 9   Lactation Tools Discussed/Used Breast pump type: Double-Electric Breast Pump Reason for Pumping: has pump, unsure if using  Interventions Interventions: Breast feeding basics reviewed;Assisted with latch;Skin to skin;Breast compression;Support pillows;Adjust position;Education  Discharge Discharge Education: Engorgement and breast care;Warning signs for feeding baby;Other (comment) (informed of OP Lactation Consultant support and appts, if needed) Pump: DEBP;Personal  Consult Status Consult Status: Complete Date: 03/06/23    Omar Person 03/06/2023, 10:38 AM

## 2023-03-09 ENCOUNTER — Inpatient Hospital Stay (HOSPITAL_COMMUNITY)
Admission: RE | Admit: 2023-03-09 | Payer: Medicaid Other | Source: Home / Self Care | Admitting: Obstetrics and Gynecology

## 2023-03-09 ENCOUNTER — Telehealth (HOSPITAL_COMMUNITY): Payer: Self-pay | Admitting: *Deleted

## 2023-03-09 ENCOUNTER — Inpatient Hospital Stay (HOSPITAL_COMMUNITY): Payer: Medicaid Other

## 2023-03-09 DIAGNOSIS — Z1331 Encounter for screening for depression: Secondary | ICD-10-CM

## 2023-03-09 NOTE — Telephone Encounter (Signed)
Hospital inpatient EPDS=10. CSW consult completed during stay. Ambulatory IBH referral made now. Dr. Crissie Reese notified via chart. Paulene Floor, RN, 03/09/23, [phone]

## 2023-03-13 ENCOUNTER — Telehealth (HOSPITAL_COMMUNITY): Payer: Self-pay | Admitting: *Deleted

## 2023-03-13 NOTE — Telephone Encounter (Signed)
Left phone voicemail message.  Duffy Rhody, RN 03-13-2023 at 9:04am

## 2023-03-28 ENCOUNTER — Ambulatory Visit (HOSPITAL_COMMUNITY): Payer: Self-pay

## 2023-03-28 NOTE — Lactation Note (Signed)
This note was copied from a baby's chart. Lactation Consultation Note  Patient Name: Nichole Ramirez [id number] Date: 03/28/2023 Age:19 wk.o. Reason for consult: Other (Comment);Follow-up assessment;Primapara;1st time breastfeeding;Term;Infant weight loss (Peds readmit, teen pregnancy, THC (+), FTT)  Visited with family of 68 weeks old female; Nichole Ramirez is a P1 and reports she's been working on bottle feeding since Peds readmission; [name] currently on Similac 22 calorie formula. Explained the importance of nipple stimulation to protect her supply, even if baby is not currently latching at the breast. Baby cueing when entered the room, revised pace feeding and the importance of feeding baby on feeding cues; parents voiced she's currently taking 2-3 oz. Similac 22 calorie formula at a time. Reviewed pumping schedule, lactogenesis III, pace feeding and anticipatory guidelines.   Feeding Mother's Current Feeding Choice: Breast Milk and Formula  Lactation Tools Discussed/Used Tools: Pump;Flanges Flange Size: 24;21 Breast pump type: Double-Electric Breast Pump Pump Education: Setup, frequency, and cleaning Reason for Pumping: FTT, pump and bottle feeding for supplementation Pumping frequency: 1 time/24 hours Pumped volume: 60 mL  Interventions Interventions: Breast feeding basics reviewed;DEBP;Education;Pace feeding  Plan Encouraged to start pumping consistently, ideally every 3 hours or whenever baby is getting a bottle Direct breastfeeding is deferred at this time but she'll F/U with LC OP Renee Rival. Parents will continue working on pace bottle feedings offering at least 60-90 ml or more if baby desires; at least + 8 times/24 hours or sooner if feeding cues are present  FOB present. All questions and concerns answered, family to contact [facility] services PRN.  Discharge Pump:  (She has a DEBP at home but she can' use it becaus it has missing pieces) WIC Program: Yes [facility]  Consult Status Consult Status: PRN Date: 03/29/23 Follow-up type: In-patient   Nichole Ramirez 03/28/2023, 1:33 PM

## 2023-03-30 ENCOUNTER — Ambulatory Visit (HOSPITAL_COMMUNITY): Payer: Self-pay

## 2023-03-30 NOTE — Lactation Note (Signed)
This note was copied from a baby's chart. Lactation Consultation Note  Patient Name: Nichole Ramirez [id number] Date: 03/30/2023 Age:19 wk.o. Reason for consult: Follow-up assessment;Other (Comment);1st time breastfeeding;Primapara;Infant weight loss;Mother's request (Peds readmit, teen pregnancy, THC (+), FTT)  Peds RN Delorise Shiner called this LC because [name] has voiced that she didn't have a pump for home use. WIC referral was sent on admission on 03/28/2023 to [facility]. Let her know to voice to parents that they can request a loaner pump with a $ 30.00 deposit and to call lactation back if this is something they would like to do prior discharge. [name] is going home today, LC OP referral sent to [employer]. as well. When this LC came to the 6M15 with [employer] loaner pump and paperwork, parents voiced that they have changed their mind and that they prefer not to do the loaner pump anymore. Let Peds RN Delorise Shiner know about this update. All questions and concerns answered, family to contact [facility] services PRN.  Feeding Mother's Current Feeding Choice: Breast Milk and Formula Nipple Type: Slow - flow  Interventions Interventions: Education  Discharge Discharge Education: Outpatient recommendation;Outpatient Epic message sent Pump: Declined  Consult Status Consult Status: Complete Date: 03/30/23 Follow-up type: Call as needed   Cobey Raineri Venetia Constable 03/30/2023, 11:47 AM

## 2023-04-13 ENCOUNTER — Ambulatory Visit: Payer: Medicaid Other | Admitting: Women's Health

## 2023-04-27 ENCOUNTER — Encounter: Payer: Self-pay | Admitting: Advanced Practice Midwife

## 2023-04-27 ENCOUNTER — Ambulatory Visit (INDEPENDENT_AMBULATORY_CARE_PROVIDER_SITE_OTHER): Payer: Medicaid Other | Admitting: Advanced Practice Midwife

## 2023-04-27 MED ORDER — MEDROXYPROGESTERONE ACETATE 150 MG/ML IM SUSY: 1.0000 mL | PREFILLED_SYRINGE | INTRAMUSCULAR | 3 refills | Status: DC

## 2023-04-27 NOTE — Patient Instructions (Signed)

## 2023-04-27 NOTE — Progress Notes (Signed)
Post Partum Visit Note   Chief Complaint:   Postpartum Care (Received depo hospital)  History of Present Illness:   Nichole Ramirez is a 17 y.o. G61P1011  female being seen today for a postpartum visit. She is 7 weeks postpartum following a spontaneous vaginal delivery at 40.2 gestational weeks. IOL: No, . Anesthesia: epidural.  Laceration: 2nd degree.  Complications: none. Inpatient contraception: yes Depo on 03/06/23 .   Pregnancy uncomplicated. Tobacco use: no. Substance use disorder: no. Last pap smear: n/a  Postpartum course has been uncomplicated. Bleeding "like a period". Bowel function is normal. Bladder function is normal. Urinary incontinence? No, fecal incontinence? No Patient is sexually active. Last sexual activity: 2 days ago.    Upstream - 04/27/23 1422       Pregnancy Intention Screening   Does the patient want to become pregnant in the next year? No    Does the patient's partner want to become pregnant in the next year? No    Would the patient like to discuss contraceptive options today? No      Contraception Wrap Up   Current Method Hormonal Injection    End Method Hormonal Injection    Contraception Counseling Provided No            The pregnancy intention screening data noted above was reviewed. Potential methods of contraception were discussed. The patient elected to proceed with Hormonal Injection.  Edinburgh Postpartum Depression Screening: Negative  Edinburgh Postnatal Depression Scale - 04/27/23 1422       Edinburgh Postnatal Depression Scale:  In the Past 7 Days   I have been able to laugh and see the funny side of things. 0    I have blamed myself unnecessarily when things went wrong. 1    I have been anxious or worried for no good reason. 1    I have felt scared or panicky for no good reason. 0    Things have been getting on top of me. 1    I have been so unhappy that I have had difficulty sleeping. 0    I have felt sad or miserable. 0    I have  been so unhappy that I have been crying. 0    The thought of harming myself has occurred to me. 0            Baby's course has been uncomplicated. Baby is feeding by bottle. Infant has a pediatrician/family doctor? Yes.  Childcare strategy if returning to work/school: yes.  Pt has material needs met for her and baby: Yes.   Review of Systems:   Pertinent items are noted in HPI Denies Abnormal vaginal discharge w/ itching/odor/irritation, headaches, visual changes, shortness of breath, chest pain, abdominal pain, severe nausea/vomiting, or problems with urination or bowel movements. Pertinent History Reviewed:  Reviewed past medical,surgical, obstetrical and family history.  Reviewed problem list, medications and allergies. OB History  Gravida Para Term Preterm AB Living  2 1 1  0 1 1  SAB IAB Ectopic Multiple Live Births  0 0 0 0 1    # Outcome Date GA Lbr Len/2nd Weight Sex Type Anes PTL Lv  2 Term 03/04/23 [redacted]w[redacted]d 12:12 / 00:45 7 lb 2 oz (3.232 kg) F Vag-Spont EPI  LIV  1 AB            Physical Assessment:   Vitals:   04/27/23 1414  BP: 114/65  Pulse: 67  Weight: 168 lb (76.2 kg)  Height: 5\' 7"  (1.702 m)  Body mass index is 26.31 kg/m.  Objective:  Blood pressure 114/65, pulse 67, height 5\' 7"  (1.702 m), weight 168 lb (76.2 kg), last menstrual period 04/15/2023, not currently breastfeeding.  General:  alert, cooperative, and no distress   Breasts:  negative  Lungs: Normal respiratory effort  Heart:  regular rate and rhythm  Abdomen: soft, non-tender,    Vulva:  normal  Vagina: normal vagina  Cervix:  normal  Corpus: Well involuted  Adnexa:  not evaluated  Rectal Exam: no hemorrhoids          No results found for this or any previous visit (from the past 24 hour(s)).  Assessment & Plan:  1) Postpartum exam 2) 7 wks s/p spontaneous vaginal delivery 3) bottle feeding 4) Depression screening 5) Contraception management: next depo due week of 8/19  Essential  components of care per ACOG recommendations:  1.  Mood and well being:  If positive depression screen, discussed and plan developed.  If using tobacco we discussed reduction/cessation and risk of relapse If current substance abuse, we discussed and referral to local resources was offered.   2. Infant care and feeding:  If breastfeeding, discussed returning to work, pumping, breastfeeding-associated pain, guidance regarding return to fertility while lactating if not using another method. If needed, patient was provided with a letter to be allowed to pump q 2-3hrs to support lactation in a private location with access to a refrigerator to store breastmilk.   Recommended that all caregivers be immunized for flu, pertussis and other preventable communicable diseases If pt does not have material needs met for her/baby, referred to local resources for help obtaining these.  3. Sexuality, contraception and birth spacing Provided guidance regarding sexuality, management of dyspareunia, and resumption of intercourse Discussed avoiding interpregnancy interval <22mths and recommended birth spacing of 18 months  4. Sleep and fatigue Discussed coping options for fatigue and sleep disruption Encouraged family/partner/community support of 4 hrs of uninterrupted sleep to help with mood and fatigue  5. Physical recovery  If pt had a C/S, assessed incisional pain and providing guidance on normal vs prolonged recovery If pt had a laceration, perineal healing and pain reviewed.  If urinary or fecal incontinence, discussed management and referred to PT or uro/gyn if indicated  Patient is safe to resume physical activity. Discussed attainment of healthy weight.  6.  Chronic disease management Discussed pregnancy complications if any, and their implications for future childbearing and long-term maternal health. Review recommendations for prevention of recurrent pregnancy complications, such as aspirin to reduce  risk of preeclampsia not applicable. Pt had GDM: No. If yes, 2hr GTT scheduled: not applicable. Reviewed medications and non-pregnant dosing including consideration of whether pt is breastfeeding using a reliable resource such as LactMed: not applicable Referred for f/u w/ PCP or subspecialist providers as indicated: not applicable  7. Health maintenance Mammogram at 19yo or earlier if indicated Pap smears as indicated  Meds:  Meds ordered this encounter  Medications   medroxyPROGESTERone Acetate 150 MG/ML SUSY    Sig: Inject 1 mL (150 mg total) into the muscle every 3 (three) months.    Dispense:  1 mL    Refill:  3    Order Specific Question:   Supervising Provider    Answer:   Lazaro Arms [[id number]]    Follow-up: Return for depo for week of 8/19.   No orders of the defined types were placed in this encounter.      Jacklyn Shell DNP, CNM [facility] [employer], [facility] 04/27/2023 2:50 PM

## 2023-05-26 ENCOUNTER — Ambulatory Visit: Payer: Medicaid Other

## 2023-06-02 ENCOUNTER — Ambulatory Visit (INDEPENDENT_AMBULATORY_CARE_PROVIDER_SITE_OTHER): Payer: Medicaid Other | Admitting: *Deleted

## 2023-06-02 DIAGNOSIS — Z3042 Encounter for surveillance of injectable contraceptive: Secondary | ICD-10-CM | POA: Diagnosis not present

## 2023-06-02 MED ORDER — MEDROXYPROGESTERONE ACETATE 150 MG/ML IM SUSY
150.0000 mg | PREFILLED_SYRINGE | Freq: Once | INTRAMUSCULAR | Status: AC
  Administered 2023-06-02: 150 mg via INTRAMUSCULAR

## 2023-06-02 NOTE — Progress Notes (Signed)
   NURSE VISIT- INJECTION  SUBJECTIVE:  Nichole Ramirez is a 17 y.o. G40P1011 female here for a Depo Provera for contraception/period management. She is a GYN patient.   OBJECTIVE:  There were no vitals taken for this visit.  Appears well, in no apparent distress  Injection administered in: Left deltoid  Meds ordered this encounter  Medications   medroxyPROGESTERone Acetate SUSY 150 mg    ASSESSMENT: GYN patient Depo Provera for contraception/period management PLAN: Follow-up: in 11-13 weeks for next Depo   Nichole Ramirez  06/02/2023 3:42 PM

## 2023-06-18 ENCOUNTER — Encounter: Payer: Self-pay | Admitting: *Deleted

## 2023-08-25 ENCOUNTER — Ambulatory Visit (INDEPENDENT_AMBULATORY_CARE_PROVIDER_SITE_OTHER): Payer: Medicaid Other | Admitting: *Deleted

## 2023-08-25 DIAGNOSIS — Z3042 Encounter for surveillance of injectable contraceptive: Secondary | ICD-10-CM | POA: Diagnosis not present

## 2023-08-25 MED ORDER — MEDROXYPROGESTERONE ACETATE 150 MG/ML IM SUSY
150.0000 mg | PREFILLED_SYRINGE | Freq: Once | INTRAMUSCULAR | Status: AC
Start: 2023-08-25 — End: 2023-08-25
  Administered 2023-08-25: 150 mg via INTRAMUSCULAR

## 2023-08-25 NOTE — Progress Notes (Signed)
   NURSE VISIT- INJECTION  SUBJECTIVE:  Nichole Ramirez is a 16 y.o. G29P1011 female here for a Depo Provera for contraception/period management. She is a GYN patient.   OBJECTIVE:  There were no vitals taken for this visit.  Appears well, in no apparent distress  Injection administered in: Left deltoid  Meds ordered this encounter  Medications   medroxyPROGESTERone Acetate SUSY 150 mg    ASSESSMENT: GYN patient Depo Provera for contraception/period management PLAN: Follow-up: in 11-13 weeks for next Depo   Jobe Marker  08/25/2023 3:27 PM

## 2023-11-17 ENCOUNTER — Ambulatory Visit: Payer: Medicaid Other

## 2023-11-24 ENCOUNTER — Ambulatory Visit: Payer: Medicaid Other | Admitting: *Deleted

## 2023-11-24 DIAGNOSIS — Z3042 Encounter for surveillance of injectable contraceptive: Secondary | ICD-10-CM | POA: Diagnosis not present

## 2023-11-24 MED ORDER — MEDROXYPROGESTERONE ACETATE 150 MG/ML IM SUSY
150.0000 mg | PREFILLED_SYRINGE | Freq: Once | INTRAMUSCULAR | Status: AC
Start: 2023-11-24 — End: 2023-11-24
  Administered 2023-11-24: 150 mg via INTRAMUSCULAR

## 2023-11-24 NOTE — Progress Notes (Signed)
   NURSE VISIT- INJECTION  SUBJECTIVE:  Nichole Ramirez is a 18 y.o. G74P1011 female here for a Depo Provera for contraception/period management. She is a GYN patient.   OBJECTIVE:  There were no vitals taken for this visit.  Appears well, in no apparent distress  Injection administered in: Left deltoid  Meds ordered this encounter  Medications   medroxyPROGESTERone Acetate SUSY 150 mg    ASSESSMENT: GYN patient Depo Provera for contraception/period management PLAN: Follow-up: in 11-13 weeks for next Depo   Malachy Mood  11/24/2023 3:42 PM

## 2023-12-14 DIAGNOSIS — J029 Acute pharyngitis, unspecified: Secondary | ICD-10-CM | POA: Diagnosis not present

## 2023-12-14 DIAGNOSIS — L0292 Furuncle, unspecified: Secondary | ICD-10-CM | POA: Diagnosis not present

## 2024-02-08 ENCOUNTER — Ambulatory Visit
Admission: RE | Admit: 2024-02-08 | Discharge: 2024-02-08 | Disposition: A | Discharge status: Home / Self Care | Source: Ambulatory Visit | Attending: Nurse Practitioner | Admitting: Nurse Practitioner

## 2024-02-08 VITALS — BP 118/79 | HR 82 | Temp 99.1°F | Resp 16

## 2024-02-08 DIAGNOSIS — J020 Streptococcal pharyngitis: Secondary | ICD-10-CM | POA: Diagnosis not present

## 2024-02-08 LAB — POCT RAPID STREP A (OFFICE): Rapid Strep A Screen: POSITIVE — AB

## 2024-02-08 MED ORDER — AMOXICILLIN 500 MG PO CAPS: 500.0000 mg | ORAL_CAPSULE | Freq: Two times a day (BID) | ORAL | 0 refills | Status: AC

## 2024-02-08 NOTE — ED Provider Notes (Signed)
 [facility]    CSN: [id number] Arrival date & time: 02/08/24  1801      History   Chief Complaint Chief Complaint  Patient presents with   Oral Swelling    I have tonsil stones and was trying to get them out and now i dont know if i have strep or its just irritated from me messing with them. It hurts to swallow drink and food. - Entered by patient    HPI Nichole Ramirez is a 18 y.o. female.   The history is provided by the patient.   Patient presents for a 1 day history of sore throat, and pain with swallowing.  Patient reports she does have underlying history of tonsil stones, and was also trying to remove those.  She states that she noticed increased pain and wondered if it was because she was trying to remove the tonsil stones.  She reports episodes of sweating and chills, along with increased fatigue.  She denies fever, headache, ear pain, nasal congestion, runny nose, cough, abdominal pain, nausea, vomiting, diarrhea, or rash.  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 No pertinent surgical history.  OB History     Gravida  2   Para  1   Term  1   Preterm  0   AB  1   Living  1      SAB  0   IAB  0   Ectopic  0   Multiple  0   Live Births  1            Home Medications    Prior to Admission medications   Medication Sig Start Date End Date Taking? Authorizing Provider  amoxicillin  (AMOXIL ) 500 MG capsule Take 1 capsule (500 mg total) by mouth 2 (two) times daily for 10 days. 02/08/24  02/18/24 Yes Leath-Warren, Belen Bowers, NP  medroxyPROGESTERone  Acetate 150 MG/ML SUSY Inject 1 mL (150 mg total) into the muscle every 3 (three) months. 04/27/23   Majel Scott, CNM    Family History Family History  Problem Relation Age of Onset   Hypertension Mother    ADD / ADHD Mother    Depression Mother    Diabetes Mother    Hypertension Father    Arthritis Father    ADD / ADHD Maternal Uncle    Thyroid disease Maternal Grandmother    Diabetes Maternal Grandmother    Depression Maternal Grandmother    Hypertension Maternal Grandmother    ADD / ADHD Maternal Grandmother    Breast cancer Maternal Grandmother    Heart disease Maternal Grandfather    Diabetes Maternal Grandfather    Macular degeneration Maternal Grandfather    Arthritis Maternal Grandfather    Drug abuse Paternal Grandmother        overdose   ADD / ADHD Cousin    Cancer Neg Hx     Social History Social History   Tobacco Use   Smoking status: Never   Smokeless tobacco: Never  Vaping Use   Vaping status: Never Used  Substance Use Topics   Alcohol use: Not Currently    Comment: occ   Drug use: Yes    Types: Marijuana    Comment: last smoked today at 0330     Allergies   Patient has no known allergies.   Review of Systems Review of Systems Per HPI  Physical Exam Triage Vital Signs ED Triage Vitals  Encounter Vitals Group     BP 02/08/24 1823 118/79     Systolic BP Percentile --      Diastolic BP Percentile --      Pulse Rate 02/08/24 1823 82     Resp 02/08/24 1823 16     Temp 02/08/24 1823 99.1 F (37.3 C)     Temp Source 02/08/24 1823 Oral     SpO2 02/08/24 1823 97 %     Weight --      Height --      Head Circumference --      Peak Flow --      Pain Score 02/08/24 1825 7     Pain Loc --      Pain Education --      Exclude from Growth Chart --    No data found.  Updated Vital Signs BP 118/79 (BP Location: Right Arm)   Pulse 82   Temp 99.1 F (37.3 C) (Oral)    Resp 16   SpO2 97%   Breastfeeding No   Visual Acuity Right Eye Distance:   Left Eye Distance:   Bilateral Distance:    Right Eye Near:   Left Eye Near:    Bilateral Near:     Physical Exam Vitals and nursing note reviewed.  Constitutional:      General: She is not in acute distress.    Appearance: Normal appearance.  HENT:     Head: Normocephalic.     Right Ear: Tympanic membrane, ear canal and external ear normal.     Left Ear: Tympanic membrane, ear canal and external ear normal.     Nose: Nose normal.     Right Turbinates: Enlarged and swollen.     Left Turbinates: Enlarged and swollen.     Right Sinus: No maxillary sinus tenderness or frontal sinus tenderness.     Left Sinus: No maxillary sinus tenderness or frontal sinus tenderness.     Mouth/Throat:     Lips: Pink.     Mouth: Mucous membranes are moist.     Pharynx: Pharyngeal swelling, oropharyngeal exudate and posterior oropharyngeal erythema present.     Tonsils: 1+ on the right. 1+ on the left.  Eyes:     Extraocular Movements: Extraocular movements intact.     Conjunctiva/sclera: Conjunctivae normal.     Pupils: Pupils are equal, round, and reactive to light.  Cardiovascular:     Rate and Rhythm: Normal rate and regular rhythm.     Pulses: Normal pulses.     Heart sounds: Normal heart sounds.  Pulmonary:     Effort: Pulmonary effort is normal. No respiratory distress.     Breath sounds: Normal breath sounds. No stridor. No wheezing, rhonchi or rales.  Abdominal:     General: Bowel sounds are normal.     Palpations: Abdomen is soft.     Tenderness: There is no abdominal tenderness.  Musculoskeletal:     Cervical back: Normal range of motion.  Skin:    General: Skin is warm and dry.  Neurological:     General: No focal deficit present.  Mental Status: She is alert and oriented to person, place, and time.  Psychiatric:        Mood and Affect: Mood normal.        Behavior: Behavior normal.       UC Treatments / Results  Labs (all labs ordered are listed, but only abnormal results are displayed) Labs Reviewed  POCT RAPID STREP A (OFFICE) - Abnormal; Notable for the following components:      Result Value   Rapid Strep A Screen Positive (*)    All other components within normal limits    EKG   Radiology No results found.  Procedures Procedures (including critical care time)  Medications Ordered in UC Medications - No data to display  Initial Impression / Assessment and Plan / UC Course  I have reviewed the triage vital signs and the nursing notes.  Pertinent labs & imaging results that were available during my care of the patient were reviewed by me and considered in my medical decision making (see chart for details).  The rapid strep test was positive.  Will start amoxicillin  500 mg twice daily for the next 10 days.  Supportive care recommendations were provided and discussed with the patient to include over-the-counter analgesics, fluids, rest, warm salt water gargles, and Chloraseptic throat spray.  Discussed indications regarding follow-up.  Patient was in agreement with this plan of care and verbalizes understanding.  All questions were answered.  Patient stable for discharge.  Work note was provided.  Final Clinical Impressions(s) / UC Diagnoses   Final diagnoses:  Streptococcal sore throat     Discharge Instructions      The rapid strep test was positive. Take medication as prescribed. Increase fluids and allow for plenty of rest. Recommend over-the-counter children's Tylenol  or ibuprofen  as needed for pain, fever, or general discomfort. Warm salt water gargles 3-4 times daily to help with throat pain or discomfort.  Also recommend over-the-counter Chloraseptic throat spray or throat lozenges while symptoms persist. Recommend a diet with soft foods to include soups, broths, puddings, yogurt, Jell-O's, or popsicles until symptoms improve. Change  toothbrush after 3 days. Follow-up with your primary care physician if symptoms fail to improve with this treatment. Follow-up as needed.    ED Prescriptions     Medication Sig Dispense Auth. Provider   amoxicillin  (AMOXIL ) 500 MG capsule Take 1 capsule (500 mg total) by mouth 2 (two) times daily for 10 days. 20 capsule Leath-Warren, Belen Bowers, NP      PDMP not reviewed this encounter.   Hardy Lia, NP 02/08/24 [phone]

## 2024-02-08 NOTE — ED Triage Notes (Signed)
 Pt reports sore throat, x 1 day, pt states she was nessing with her tonsils to get rid of tonsil stones, unsure if she irritated her throat or if it is strep.

## 2024-02-08 NOTE — Discharge Instructions (Addendum)
 The rapid strep test was positive. Take medication as prescribed. Increase fluids and allow for plenty of rest. Recommend over-the-counter children's Tylenol  or ibuprofen  as needed for pain, fever, or general discomfort. Warm salt water gargles 3-4 times daily to help with throat pain or discomfort.  Also recommend over-the-counter Chloraseptic throat spray or throat lozenges while symptoms persist. Recommend a diet with soft foods to include soups, broths, puddings, yogurt, Jell-O's, or popsicles until symptoms improve. Change toothbrush after 3 days. Follow-up with your primary care physician if symptoms fail to improve with this treatment. Follow-up as needed.

## 2024-02-16 ENCOUNTER — Ambulatory Visit: Payer: Medicaid Other

## 2024-02-18 ENCOUNTER — Ambulatory Visit

## 2024-02-18 DIAGNOSIS — Z3042 Encounter for surveillance of injectable contraceptive: Secondary | ICD-10-CM | POA: Diagnosis not present

## 2024-02-18 MED ORDER — MEDROXYPROGESTERONE ACETATE 150 MG/ML IM SUSY
150.0000 mg | PREFILLED_SYRINGE | Freq: Once | INTRAMUSCULAR | Status: AC
Start: 2024-02-18 — End: 2024-02-18
  Administered 2024-02-18: 150 mg via INTRAMUSCULAR

## 2024-02-18 NOTE — Progress Notes (Signed)
   NURSE VISIT- INJECTION  SUBJECTIVE:  Nichole Ramirez is a 18 y.o. G50P1011 female here for a Depo Provera  for contraception/period management. She is a GYN patient.   OBJECTIVE:  There were no vitals taken for this visit.  Appears well, in no apparent distress  Injection administered in: Right deltoid  Meds ordered this encounter  Medications   medroxyPROGESTERone  Acetate SUSY 150 mg    ASSESSMENT: GYN patient Depo Provera  for contraception/period management PLAN: Follow-up: in 11-13 weeks for next Depo   Kerrie Peek  02/18/2024 3:47 PM

## 2024-02-19 ENCOUNTER — Ambulatory Visit: Payer: Self-pay

## 2024-02-19 NOTE — Telephone Encounter (Signed)
  Chief Complaint: cough  Symptoms: productive cough, flu-like symptoms   Disposition: [] ED /[x] Urgent Care (no appt availability in office) / [] Appointment(In office/virtual)/ []  [facility]/ [] Home Care/ [] Refused Recommended Disposition /[] [place] [facility]/ []  Follow-up with PCP Additional Notes: Pt calling to set up new PCP but has concern of cough, mucus, flu-like symptoms with some mild SOB when coughing. Pt has asthma and has been using inhaler and it has helped some. Pt states the cough is better today since she is able to cough up mucus. Mucus was dark green-blackish color once but is now clear. Pt stated the cough is painful and feels pressure in head and chest. Pt denies fever. RN advised pt to be seen at [facility] today. New PCP at [facility] with NP Zarwalo was made for 5/19. RN gave care advice and pt verbalized understanding and stated she will go to UC today.             Copied from CRM [id number]. Topic: Clinical - Red Word Triage >> Feb 19, 2024  9:05 AM [place] B wrote: [facility] that prompted transfer to Nurse Triage: pt has consistent cough and  green phlegm, flu like symptoms of chills, going hot and cold Reason for Disposition  [1] MILD difficulty breathing (e.g., minimal/no SOB at rest, SOB with walking, pulse <100) AND [2] still present when not coughing  Answer Assessment - Initial Assessment Questions 1. ONSET: "When did the cough begin?"      3 days  2. SEVERITY: "How bad is the cough today?"      Hurts head and chest to cough  3. SPUTUM: "Describe the color of your sputum" (none, dry cough; clear, white, yellow, green)     Green/black thick(once) clear 4. HEMOPTYSIS: "Are you coughing up any blood?" If so ask: "How much?" (flecks, streaks, tablespoons, etc.)     Denies  5. DIFFICULTY BREATHING: "Are you having difficulty breathing?" If Yes, ask: "How bad is it?" (e.g., mild, moderate, severe)    - MILD: No SOB at rest, mild SOB with walking, speaks  normally in sentences, can lie down, no retractions, pulse < 100.    - MODERATE: SOB at rest, SOB with minimal exertion and prefers to sit, cannot lie down flat, speaks in phrases, mild retractions, audible wheezing, pulse 100-120.    - SEVERE: Very SOB at rest, speaks in single words, struggling to breathe, sitting hunched forward, retractions, pulse > 120      mild 6. FEVER: "Do you have a fever?" If Yes, ask: "What is your temperature, how was it measured, and when did it start?"     Denies  7. CARDIAC HISTORY: "Do you have any history of heart disease?" (e.g., heart attack, congestive heart failure)      Na  8. LUNG HISTORY: "Do you have any history of lung disease?"  (e.g., pulmonary embolus, asthma, emphysema)     Asthma -uses inhaler  9. PE RISK FACTORS: "Do you have a history of blood clots?" (or: recent major surgery, recent prolonged travel, bedridden)     Na  10. OTHER SYMPTOMS: "Do you have any other symptoms?" (e.g., runny nose, wheezing, chest pain)       Flu like symptoms  11. PREGNANCY: "Is there any chance you are pregnant?" "When was your last menstrual period?"       Not sure  Protocols used: Cough - Acute Productive-A-AH

## 2024-02-21 ENCOUNTER — Encounter: Payer: Self-pay | Admitting: Emergency Medicine

## 2024-02-21 ENCOUNTER — Other Ambulatory Visit: Payer: Self-pay

## 2024-02-21 ENCOUNTER — Ambulatory Visit: Admission: EM | Admit: 2024-02-21 | Discharge: 2024-02-21 | Disposition: A | Discharge status: Home / Self Care

## 2024-02-21 DIAGNOSIS — J039 Acute tonsillitis, unspecified: Secondary | ICD-10-CM | POA: Diagnosis not present

## 2024-02-21 MED ORDER — AZITHROMYCIN 250 MG PO TABS: ORAL_TABLET | ORAL | 0 refills | Status: DC

## 2024-02-21 NOTE — ED Triage Notes (Addendum)
 Pt reports chills, sore throat x4 days. Reports pain increased x2 days ago. Reports hx of tonsil stones and reports "I started messing with my tonsils and now they are bleeding at times" pt has been intermittently taking abx since px on 02/08/24.

## 2024-02-21 NOTE — Discharge Instructions (Signed)
 Take the full course of antibiotics prescribed, and change out your toothbrush after 1-2 doses of the medication so that you do not reinfect yourself.  I am concerned that your strep throat is not improving because you have been taking your prior antibiotic intermittently which can cause resistance issues.  Follow-up if your symptoms continue to worsen

## 2024-02-21 NOTE — ED Provider Notes (Signed)
 [facility]    CSN: [id number] Arrival date & time: 02/21/24  0851      History   Chief Complaint Chief Complaint  Patient presents with   Sore Throat    HPI Nichole Ramirez is a 18 y.o. female.   Patient presented today with a 4-day history of chills, sore throat, swollen feeling tonsils.  She states she does have a history of tonsil stones which she has been picking at her tonsils causing them to bleed.  She was diagnosed on 02/08/2024 with strep tonsillitis and given amoxicillin  but states she has been taking it here and there because she keeps feeling better but then symptoms keep returning.    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 No pertinent surgical history.  OB History     Gravida  2   Para  1   Term  1   Preterm  0   AB  1   Living  1      SAB  0   IAB  0   Ectopic  0   Multiple  0   Live Births  1            Home Medications    Prior to Admission medications   Medication Sig Start Date End Date Taking? Authorizing Provider  amoxicillin  (AMOXIL ) 500 MG capsule Take 500 mg by mouth 2 (two) times daily. 02/15/24  Yes [provider]  azithromycin (ZITHROMAX) 250 MG tablet Take first 2 tablets together, then 1 every day until finished. 02/21/24  Yes Corbin Dess, PA-C  medroxyPROGESTERone  Acetate 150 MG/ML SUSY Inject 1 mL (150 mg total) into the muscle every 3 (three) months. 04/27/23   Majel Scott, CNM    Family History Family History   Problem Relation Age of Onset   Hypertension Mother    ADD / ADHD Mother    Depression Mother    Diabetes Mother    Hypertension Father    Arthritis Father    ADD / ADHD Maternal Uncle    Thyroid disease Maternal Grandmother    Diabetes Maternal Grandmother    Depression Maternal Grandmother    Hypertension Maternal Grandmother    ADD / ADHD Maternal Grandmother    Breast cancer Maternal Grandmother    Heart disease Maternal Grandfather    Diabetes Maternal Grandfather    Macular degeneration Maternal Grandfather    Arthritis Maternal Grandfather    Drug abuse Paternal Grandmother        overdose   ADD / ADHD Cousin    Cancer Neg Hx     Social History Social History   Tobacco Use   Smoking status: Never   Smokeless tobacco: Never  Vaping Use   Vaping status: Never Used  Substance Use Topics   Alcohol use: Not Currently    Comment: occ   Drug use: Yes    Types: Marijuana    Comment: last smoked today at 0330     Allergies   Patient has no known allergies.   Review of  Systems Review of Systems PER HPI  Physical Exam Triage Vital Signs ED Triage Vitals  Encounter Vitals Group     BP 02/21/24 0907 111/71     Systolic BP Percentile --      Diastolic BP Percentile --      Pulse Rate 02/21/24 0907 98     Resp 02/21/24 0907 18     Temp 02/21/24 0907 98.4 F (36.9 C)     Temp Source 02/21/24 0907 Oral     SpO2 02/21/24 0907 98 %     Weight --      Height --      Head Circumference --      Peak Flow --      Pain Score 02/21/24 0959 0     Pain Loc --      Pain Education --      Exclude from Growth Chart --    No data found.  Updated Vital Signs BP 111/71 (BP Location: Right Arm)   Pulse 98   Temp 98.4 F (36.9 C) (Oral)   Resp 18   LMP 02/13/2024   SpO2 98%   Breastfeeding No   Visual Acuity Right Eye Distance:   Left Eye Distance:   Bilateral Distance:    Right Eye Near:   Left Eye Near:    Bilateral Near:     Physical Exam Vitals  and nursing note reviewed.  Constitutional:      Appearance: Normal appearance.  HENT:     Head: Atraumatic.     Right Ear: Tympanic membrane and external ear normal.     Left Ear: Tympanic membrane and external ear normal.     Nose: Nose normal.     Mouth/Throat:     Mouth: Mucous membranes are moist.     Pharynx: Posterior oropharyngeal erythema present. No oropharyngeal exudate.  Eyes:     Extraocular Movements: Extraocular movements intact.     Conjunctiva/sclera: Conjunctivae normal.  Cardiovascular:     Rate and Rhythm: Normal rate and regular rhythm.     Heart sounds: Normal heart sounds.  Pulmonary:     Effort: Pulmonary effort is normal.     Breath sounds: Normal breath sounds. No wheezing or rales.  Musculoskeletal:        General: Normal range of motion.     Cervical back: Normal range of motion and neck supple.  Lymphadenopathy:     Cervical: Cervical adenopathy present.  Skin:    General: Skin is warm and dry.  Neurological:     Mental Status: She is alert and oriented to person, place, and time.  Psychiatric:        Mood and Affect: Mood normal.        Thought Content: Thought content normal.      UC Treatments / Results  Labs (all labs ordered are listed, but only abnormal results are displayed) Labs Reviewed - No data to display  EKG   Radiology No results found.  Procedures Procedures (including critical care time)  Medications Ordered in UC Medications - No data to display  Initial Impression / Assessment and Plan / UC Course  I have reviewed the triage vital signs and the nursing notes.  Pertinent labs & imaging results that were available during my care of the patient were reviewed by me and considered in my medical decision making (see chart for details).     Will forego repeat strep testing today as she has been intermittently taking amoxicillin  for  the last 2 weeks after diagnosis on 02/08/2024.  Discussed with patient the importance of  taking antibiotics as prescribed, concern for possible resistant strain given the intermittent nature that she has been taking the medication.  Will switch to Zithromax and discussed taking full course as prescribed, changing toothbrush after 1-2 doses and follow-up for unresolving symptoms.  Final Clinical Impressions(s) / UC Diagnoses   Final diagnoses:  Acute tonsillitis, unspecified etiology     Discharge Instructions      Take the full course of antibiotics prescribed, and change out your toothbrush after 1-2 doses of the medication so that you do not reinfect yourself.  I am concerned that your strep throat is not improving because you have been taking your prior antibiotic intermittently which can cause resistance issues.  Follow-up if your symptoms continue to worsen  ED Prescriptions     Medication Sig Dispense Auth. Provider   azithromycin (ZITHROMAX) 250 MG tablet Take first 2 tablets together, then 1 every day until finished. 6 tablet Corbin Dess, [place]      PDMP not reviewed this encounter.   Corbin Dess, [place] 02/21/24 1117

## 2024-02-22 ENCOUNTER — Ambulatory Visit: Payer: Self-pay | Admitting: Family Medicine

## 2024-03-14 ENCOUNTER — Ambulatory Visit

## 2024-03-29 ENCOUNTER — Ambulatory Visit
Admission: EM | Admit: 2024-03-29 | Discharge: 2024-03-29 | Disposition: A | Discharge status: Home / Self Care | Attending: Nurse Practitioner | Admitting: Nurse Practitioner

## 2024-03-29 DIAGNOSIS — L02811 Cutaneous abscess of head [any part, except face]: Secondary | ICD-10-CM

## 2024-03-29 MED ORDER — DOXYCYCLINE HYCLATE 100 MG PO CAPS: 100.0000 mg | ORAL_CAPSULE | Freq: Two times a day (BID) | ORAL | 0 refills | Status: AC

## 2024-03-29 NOTE — Discharge Instructions (Addendum)
 Take the doxycycline 100 mg twice daily for 7 days to treat for infection in the abscess of your scalp.  You can also take Tylenol  or ibuprofen  as needed for pain.  Also start warm compresses to the area to help with drainage.  Seek care if symptoms do not improve with treatment.

## 2024-03-29 NOTE — ED Triage Notes (Signed)
 Pt reports burning and tenderness of the scalp, there is a bump on it and soreness  x 1 week bump present x 4 days.

## 2024-03-29 NOTE — ED Provider Notes (Signed)
 [facility]    CSN: [id number] Arrival date & time: 03/29/24  1207      History   Chief Complaint No chief complaint on file.   HPI Nichole Ramirez is a 18 y.o. female.   Patient presents today for soreness on her scalp for the past week with a bump that has been present on her scalp for the past 4 days.  She reports is burning and tingling and there is one area that is very sensitive to touch.  She denies any active drainage, fever, nausea/vomiting.  Denies history of similar.  Patient is confident she is not pregnant; receives Depo-Provera  injection for contraception and is up-to-date on the contraception.    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 No pertinent surgical history.  OB History     Gravida  2   Para  1   Term  1   Preterm  0   AB  1   Living  1      SAB  0   IAB  0   Ectopic  0   Multiple  0   Live Births  1            Home Medications    Prior to Admission medications   Medication Sig Start Date End Date Taking? Authorizing Provider  doxycycline (VIBRAMYCIN) 100 MG capsule Take 1 capsule (100 mg total) by mouth 2 (two) times daily for 7 days. 03/29/24 04/05/24 Yes Chandra Harlene LABOR, NP  medroxyPROGESTERone  Acetate 150 MG/ML SUSY Inject 1 mL (150 mg total) into the muscle every 3 (three) months. 04/27/23   Newton Mering, CNM    Family History Family History  Problem Relation Age of Onset   Hypertension Mother    ADD / ADHD Mother    Depression Mother     Diabetes Mother    Hypertension Father    Arthritis Father    ADD / ADHD Maternal Uncle    Thyroid disease Maternal Grandmother    Diabetes Maternal Grandmother    Depression Maternal Grandmother    Hypertension Maternal Grandmother    ADD / ADHD Maternal Grandmother    Breast cancer Maternal Grandmother    Heart disease Maternal Grandfather    Diabetes Maternal Grandfather    Macular degeneration Maternal Grandfather    Arthritis Maternal Grandfather    Drug abuse Paternal Grandmother        overdose   ADD / ADHD Cousin    Cancer Neg Hx     Social History Social History   Tobacco Use   Smoking status: Never   Smokeless tobacco: Never  Vaping Use   Vaping status: Never Used  Substance Use Topics   Alcohol use: Not Currently    Comment: occ   Drug use: Yes    Types: Marijuana    Comment: last smoked today at 0330     Allergies   Patient has no known allergies.   Review of Systems Review of Systems Per HPI  Physical Exam Triage Vital Signs ED Triage Vitals [  03/29/24 1216]  Encounter Vitals Group     BP 103/67     Girls Systolic BP Percentile      Girls Diastolic BP Percentile      Boys Systolic BP Percentile      Boys Diastolic BP Percentile      Pulse Rate 73     Resp 18     Temp 98.1 F (36.7 C)     Temp Source Oral     SpO2 98 %     Weight      Height      Head Circumference      Peak Flow      Pain Score      Pain Loc      Pain Education      Exclude from Growth Chart    No data found.  Updated Vital Signs BP 103/67 (BP Location: Right Arm)   Pulse 73   Temp 98.1 F (36.7 C) (Oral)   Resp 18   LMP  (LMP Unknown)   SpO2 98%   Breastfeeding No   Visual Acuity Right Eye Distance:   Left Eye Distance:   Bilateral Distance:    Right Eye Near:   Left Eye Near:    Bilateral Near:     Physical Exam Vitals and nursing note reviewed.  Constitutional:      General: She is not in acute distress.    Appearance: Normal appearance.  She is not toxic-appearing.  HENT:     Mouth/Throat:     Mouth: Mucous membranes are moist.     Pharynx: Oropharynx is clear.  Pulmonary:     Effort: Pulmonary effort is normal. No respiratory distress.   Skin:    General: Skin is warm and dry.     Capillary Refill: Capillary refill takes less than 2 seconds.     Findings: Abscess present.     Comments: Fluctuant abscess with active drainage noted to right occipital scalp.  There is surrounding erythema and it is tender to touch.   Neurological:     Mental Status: She is alert and oriented to person, place, and time.   Psychiatric:        Behavior: Behavior is cooperative.      UC Treatments / Results  Labs (all labs ordered are listed, but only abnormal results are displayed) Labs Reviewed - No data to display  EKG   Radiology No results found.  Procedures Procedures (including critical care time)  Medications Ordered in UC Medications - No data to display  Initial Impression / Assessment and Plan / UC Course  I have reviewed the triage vital signs and the nursing notes.  Pertinent labs & imaging results that were available during my care of the patient were reviewed by me and considered in my medical decision making (see chart for details).   Patient is well-appearing, normotensive, afebrile, not tachycardic, not tachypneic, oxygenating well on room air.   1. Abscess of scalp No indication for I&D today as area is already actively draining Recommended warm compresses, start oral doxycycline Return and ER precautions discussed with patient  The patient was given the opportunity to ask questions.  All questions answered to their satisfaction.  The patient is in agreement to this plan.   Final Clinical Impressions(s) / UC Diagnoses   Final diagnoses:  Abscess of scalp     Discharge Instructions      Take the doxycycline 100 mg twice daily for 7 days to treat  for infection in the abscess of your scalp.   You can also take Tylenol  or ibuprofen  as needed for pain.  Also start warm compresses to the area to help with drainage.  Seek care if symptoms do not improve with treatment.     ED Prescriptions     Medication Sig Dispense Auth. Provider   doxycycline (VIBRAMYCIN) 100 MG capsule Take 1 capsule (100 mg total) by mouth 2 (two) times daily for 7 days. 14 capsule Chandra Harlene LABOR, NP      PDMP not reviewed this encounter.   Chandra Harlene LABOR, NP 03/29/24 [phone]

## 2024-05-02 ENCOUNTER — Ambulatory Visit (INDEPENDENT_AMBULATORY_CARE_PROVIDER_SITE_OTHER): Admitting: Family Medicine

## 2024-05-02 VITALS — BP 116/72 | HR 68 | Resp 18 | Ht 67.0 in | Wt 183.0 lb

## 2024-05-02 DIAGNOSIS — Z1159 Encounter for screening for other viral diseases: Secondary | ICD-10-CM

## 2024-05-02 DIAGNOSIS — R7301 Impaired fasting glucose: Secondary | ICD-10-CM | POA: Diagnosis not present

## 2024-05-02 DIAGNOSIS — F988 Other specified behavioral and emotional disorders with onset usually occurring in childhood and adolescence: Secondary | ICD-10-CM | POA: Diagnosis not present

## 2024-05-02 DIAGNOSIS — E7849 Other hyperlipidemia: Secondary | ICD-10-CM

## 2024-05-02 DIAGNOSIS — A749 Chlamydial infection, unspecified: Secondary | ICD-10-CM | POA: Diagnosis not present

## 2024-05-02 DIAGNOSIS — E038 Other specified hypothyroidism: Secondary | ICD-10-CM | POA: Diagnosis not present

## 2024-05-02 DIAGNOSIS — E559 Vitamin D deficiency, unspecified: Secondary | ICD-10-CM | POA: Diagnosis not present

## 2024-05-02 DIAGNOSIS — Z114 Encounter for screening for human immunodeficiency virus [HIV]: Secondary | ICD-10-CM

## 2024-05-02 NOTE — Patient Instructions (Signed)
 I appreciate the opportunity to provide care to you today!    Follow up:  6 months  Labs: please stop by the lab today to get your blood drawn (CBC, CMP, TSH, Lipid profile, HgA1c, Vit D)  Screening: HIV and Hep C  For a Healthier YOU, I Recommend: Reducing your intake of sugar, sodium, carbohydrates, and saturated fats. Increasing your fiber intake by incorporating more whole grains, fruits, and vegetables into your meals. Setting healthy goals with a focus on lowering your consumption of carbs, sugar, and unhealthy fats. Adding variety to your diet by including a wide range of fruits and vegetables. Cutting back on soda and limiting processed foods as much as possible. Staying active: In addition to taking your weight loss medication, aim for at least 150 minutes of moderate-intensity physical activity each week for optimal results.  Please follow up if your symptoms worsen or fail to improve.    Please continue to a heart-healthy diet and increase your physical activities. Try to exercise for at least five days a week.    It was a pleasure to see you and I look forward to continuing to work together on your health and well-being. Please do not hesitate to call the office if you need care or have questions about your care.  In case of emergency, please visit the Emergency Department for urgent care, or contact our clinic at [phone] to schedule an appointment. We're here to help you!   Have a wonderful day and week. With Gratitude, Tyia Binford MSN, FNP-BC

## 2024-05-02 NOTE — Assessment & Plan Note (Signed)
 Patient remains stable on stimulant medication, which she reports taking on an as-needed basis. No complaints or concerns were noted at this time.

## 2024-05-02 NOTE — Progress Notes (Signed)
 New Patient Office Visit  Subjective:  Patient ID: Nichole Ramirez, female    DOB: [birth year]  Age: 18 y.o. MRN: [id number]  CC:  Chief Complaint  Patient presents with   Establish Care    New pt appointment     HPI Nichole Ramirez is a 18 y.o. female with past medical history of ADD presents for establishing care.  Past Medical History:  Diagnosis Date   ADHD (attention deficit hyperactivity disorder)    Anxiety    Asthma    Depression    Headache     History reviewed. No pertinent surgical history.  Family History  Problem Relation Age of Onset   Hypertension Mother    ADD / ADHD Mother    Depression Mother    Diabetes Mother    Hypertension Father    Arthritis Father    ADD / ADHD Maternal Uncle    Thyroid disease Maternal Grandmother    Diabetes Maternal Grandmother    Depression Maternal Grandmother    Hypertension Maternal Grandmother    ADD / ADHD Maternal Grandmother    Breast cancer Maternal Grandmother    Heart disease Maternal Grandfather    Diabetes Maternal Grandfather    Macular degeneration Maternal Grandfather    Arthritis Maternal Grandfather    Drug abuse Paternal Grandmother        overdose   ADD / ADHD Cousin    ADD / ADHD Paternal Uncle    Cancer Neg Hx     Social History   Socioeconomic History   Marital status: Single    Spouse name: Not on file   Number of children: Not on file   Years of education: Not on file   Highest education level: GED or equivalent  Occupational History   Not on file  Tobacco Use   Smoking status: Never   Smokeless tobacco: Never  Vaping Use   Vaping status: Never Used  Substance and Sexual Activity   Alcohol use: Not Currently    Comment: occ   Drug use: Yes    Types: Marijuana    Comment: last smoked today at 0330   Sexual activity: Yes    Birth control/protection: Injection  Other Topics Concern   Not on file  Social History Narrative   Lives with mother    Social Drivers of Health    Financial Resource Strain: Medium Risk (05/02/2024)   Overall Financial Resource Strain (CARDIA)    Difficulty of Paying Living Expenses: Somewhat hard  Food Insecurity: Food Insecurity Present (05/02/2024)   Hunger Vital Sign    Worried About Running Out of Food in the Last Year: Sometimes true    Ran Out of Food in the Last Year: Sometimes true  Transportation Needs: Unmet Transportation Needs (05/02/2024)   PRAPARE - Transportation    Lack of Transportation (Medical): No    Lack of Transportation (Non-Medical): Yes  Physical Activity: Sufficiently Active (05/02/2024)   Exercise Vital Sign    Days of Exercise per Week: 4 days    Minutes of Exercise per Session: 100 min  Stress: No Stress Concern Present (05/02/2024)   [employer] of Occupational Health - Occupational Stress Questionnaire    Feeling of Stress: Only a little  Social Connections: Moderately Isolated (05/02/2024)   Social Connection and Isolation Panel    Frequency of Communication with Friends and Family: Twice a week    Frequency of Social Gatherings with Friends and Family: Twice a week    Attends  Religious Services: 1 to 4 times per year    Active Member of Clubs or Organizations: No    Attends Banker Meetings: Not on file    Marital Status: Never married  Intimate Partner Violence: Not At Risk (03/04/2023)   Humiliation, Afraid, Rape, and Kick questionnaire    Fear of Current or Ex-Partner: No    Emotionally Abused: No    Physically Abused: No    Sexually Abused: No    ROS Review of Systems  Constitutional:  Negative for chills and fever.  Eyes:  Negative for visual disturbance.  Respiratory:  Negative for chest tightness and shortness of breath.   Neurological:  Negative for dizziness and headaches.    Objective:   Today's Vitals: BP 116/72   Pulse 68   Resp 18   Ht 5' 7 (1.702 m)   Wt 183 lb 0.6 oz (83 kg)   SpO2 97%   BMI 28.67 kg/m   Physical Exam HENT:     Head:  Normocephalic.     Mouth/Throat:     Mouth: Mucous membranes are moist.  Cardiovascular:     Rate and Rhythm: Normal rate.     Heart sounds: Normal heart sounds.  Pulmonary:     Effort: Pulmonary effort is normal.     Breath sounds: Normal breath sounds.  Neurological:     Mental Status: She is alert.      Assessment & Plan:   ADD (attention deficit disorder) without hyperactivity Assessment & Plan: Patient remains stable on stimulant medication, which she reports taking on an as-needed basis. No complaints or concerns were noted at this time.   IFG (impaired fasting glucose) -     Hemoglobin A1c  Vitamin D  deficiency -     VITAMIN D  25 Hydroxy (Vit-D Deficiency, Fractures)  Need for hepatitis C screening test -     Hepatitis C antibody  Encounter for screening for HIV -     HIV Antibody (routine testing w rflx)  TSH (thyroid-stimulating hormone deficiency) -     TSH + free T4  Other hyperlipidemia -     Lipid panel -     CMP14+EGFR -     CBC with Differential/Platelet  Chlamydia -     Chlamydia/GC NAA, Confirmation  Note: This chart has been completed using [employer] software, and while attempts have been made to ensure accuracy, certain words and phrases may not be transcribed as intended.     Follow-up: Return in about 6 months (around 11/02/2024).   Angy Swearengin, FNP

## 2024-05-03 LAB — CMP14+EGFR
ALT: 14 IU/L (ref 0–32)
AST: 19 IU/L (ref 0–40)
Albumin: 4.5 g/dL (ref 4.0–5.0)
Alkaline Phosphatase: 88 IU/L (ref 42–106)
BUN/Creatinine Ratio: 9 (ref 9–23)
BUN: 8 mg/dL (ref 6–20)
Bilirubin Total: 0.3 mg/dL (ref 0.0–1.2)
CO2: 19 mmol/L — ABNORMAL LOW (ref 20–29)
Calcium: 9.4 mg/dL (ref 8.7–10.2)
Chloride: 106 mmol/L (ref 96–106)
Creatinine, Ser: 0.85 mg/dL (ref 0.57–1.00)
Globulin, Total: 2.5 g/dL (ref 1.5–4.5)
Glucose: 87 mg/dL (ref 70–99)
Potassium: 4.2 mmol/L (ref 3.5–5.2)
Sodium: 140 mmol/L (ref 134–144)
Total Protein: 7 g/dL (ref 6.0–8.5)
eGFR: 101 mL/min/1.73 (ref >59)

## 2024-05-03 LAB — CBC WITH DIFFERENTIAL/PLATELET
Basophils Absolute: 0.1 x10E3/uL (ref 0.0–0.2)
Basos: 1 %
EOS (ABSOLUTE): 0.3 x10E3/uL (ref 0.0–0.4)
Eos: 3 %
Hematocrit: 41.2 % (ref 34.0–46.6)
Hemoglobin: 13.1 g/dL (ref 11.1–15.9)
Immature Grans (Abs): 0 x10E3/uL (ref 0.0–0.1)
Immature Granulocytes: 0 %
Lymphocytes Absolute: 2.8 x10E3/uL (ref 0.7–3.1)
Lymphs: 34 %
MCH: 27.9 pg (ref 26.6–33.0)
MCHC: 31.8 g/dL (ref 31.5–35.7)
MCV: 88 fL (ref 79–97)
Monocytes Absolute: 0.6 x10E3/uL (ref 0.1–0.9)
Monocytes: 7 %
Neutrophils Absolute: 4.5 x10E3/uL (ref 1.4–7.0)
Neutrophils: 55 %
Platelets: 255 x10E3/uL (ref 150–450)
RBC: 4.7 x10E6/uL (ref 3.77–5.28)
RDW: 13.6 % (ref 11.7–15.4)
WBC: 8.3 x10E3/uL (ref 3.4–10.8)

## 2024-05-03 LAB — LIPID PANEL
Chol/HDL Ratio: 3.4 ratio (ref 0.0–4.4)
Cholesterol, Total: 159 mg/dL (ref 100–169)
HDL: 47 mg/dL (ref >39)
LDL Chol Calc (NIH): 100 mg/dL (ref 0–109)
Triglycerides: 60 mg/dL (ref 0–89)
VLDL Cholesterol Cal: 12 mg/dL (ref 5–40)

## 2024-05-03 LAB — HIV ANTIBODY (ROUTINE TESTING W REFLEX): HIV Screen 4th Generation wRfx: NONREACTIVE

## 2024-05-03 LAB — TSH+FREE T4
Free T4: 0.95 ng/dL (ref 0.93–1.60)
TSH: 0.803 u[IU]/mL (ref 0.450–4.500)

## 2024-05-03 LAB — HEPATITIS C ANTIBODY: Hep C Virus Ab: NONREACTIVE

## 2024-05-03 LAB — HEMOGLOBIN A1C
Est. average glucose Bld gHb Est-mCnc: 108 mg/dL
Hgb A1c MFr Bld: 5.4 % (ref 4.8–5.6)

## 2024-05-03 LAB — VITAMIN D 25 HYDROXY (VIT D DEFICIENCY, FRACTURES): Vit D, 25-Hydroxy: 29.5 ng/mL — ABNORMAL LOW (ref 30.0–100.0)

## 2024-05-04 LAB — CHLAMYDIA/GC NAA, CONFIRMATION
Chlamydia trachomatis, NAA: NEGATIVE
Neisseria gonorrhoeae, NAA: NEGATIVE

## 2024-05-12 ENCOUNTER — Other Ambulatory Visit: Payer: Self-pay | Admitting: Advanced Practice Midwife

## 2024-05-12 ENCOUNTER — Ambulatory Visit: Payer: Self-pay | Admitting: Family Medicine

## 2024-05-12 ENCOUNTER — Ambulatory Visit: Admitting: *Deleted

## 2024-05-12 DIAGNOSIS — Z3042 Encounter for surveillance of injectable contraceptive: Secondary | ICD-10-CM | POA: Diagnosis not present

## 2024-05-12 MED ORDER — MEDROXYPROGESTERONE ACETATE 150 MG/ML IM SUSY
150.0000 mg | PREFILLED_SYRINGE | Freq: Once | INTRAMUSCULAR | Status: AC
Start: 2024-05-12 — End: 2024-05-12
  Administered 2024-05-12: 150 mg via INTRAMUSCULAR

## 2024-05-12 NOTE — Progress Notes (Signed)
   NURSE VISIT- INJECTION  SUBJECTIVE:  Nichole Ramirez is a 18 y.o. G34P1011 female here for a Depo Provera  for contraception/period management. She is a GYN patient.   OBJECTIVE:  There were no vitals taken for this visit.  Appears well, in no apparent distress  Injection administered in: Left deltoid  No orders of the defined types were placed in this encounter.   ASSESSMENT: GYN patient Depo Provera  for contraception/period management PLAN: Follow-up: in 11-13 weeks for next Depo   Alan LITTIE Fischer  05/12/2024 3:38 PM

## 2024-06-08 DIAGNOSIS — J060 Acute laryngopharyngitis: Secondary | ICD-10-CM | POA: Diagnosis not present

## 2024-06-24 ENCOUNTER — Encounter: Payer: Self-pay | Admitting: *Deleted

## 2024-08-04 ENCOUNTER — Ambulatory Visit (INDEPENDENT_AMBULATORY_CARE_PROVIDER_SITE_OTHER): Admitting: *Deleted

## 2024-08-04 DIAGNOSIS — Z3042 Encounter for surveillance of injectable contraceptive: Secondary | ICD-10-CM

## 2024-08-04 MED ORDER — MEDROXYPROGESTERONE ACETATE 150 MG/ML IM SUSY
150.0000 mg | PREFILLED_SYRINGE | Freq: Once | INTRAMUSCULAR | Status: AC
Start: 2024-08-04 — End: 2024-08-04
  Administered 2024-08-04: 150 mg via INTRAMUSCULAR

## 2024-08-04 NOTE — Progress Notes (Signed)
   NURSE VISIT- INJECTION  SUBJECTIVE:  Nichole Ramirez is a 18 y.o. G41P1011 female here for a Depo Provera  for contraception/period management. She is a GYN patient.   OBJECTIVE:  There were no vitals taken for this visit.  Appears well, in no apparent distress  Injection administered in: Right deltoid  Meds ordered this encounter  Medications   medroxyPROGESTERone  Acetate SUSY 150 mg    ASSESSMENT: GYN patient Depo Provera  for contraception/period management PLAN: Follow-up: in 11-13 weeks for next Depo   Alan LITTIE Fischer  08/04/2024 3:47 PM

## 2024-10-05 ENCOUNTER — Encounter: Payer: Self-pay | Admitting: Family Medicine

## 2024-10-27 ENCOUNTER — Ambulatory Visit

## 2024-11-02 ENCOUNTER — Ambulatory Visit: Admitting: Nurse Practitioner

## 2024-11-02 VITALS — BP 111/71 | HR 80 | Ht 67.0 in | Wt 168.0 lb

## 2024-11-02 DIAGNOSIS — M25512 Pain in left shoulder: Secondary | ICD-10-CM | POA: Diagnosis not present

## 2024-11-02 DIAGNOSIS — R41840 Attention and concentration deficit: Secondary | ICD-10-CM

## 2024-11-02 MED ORDER — CYCLOBENZAPRINE HCL 5 MG PO TABS: 5.0000 mg | ORAL_TABLET | Freq: Three times a day (TID) | ORAL | 0 refills | Status: AC | PRN

## 2024-11-02 MED ORDER — ATOMOXETINE HCL 10 MG PO CAPS: 10.0000 mg | ORAL_CAPSULE | Freq: Every day | ORAL | 2 refills | Status: AC

## 2024-11-02 MED ORDER — NAPROXEN 500 MG PO TABS: 500.0000 mg | ORAL_TABLET | Freq: Two times a day (BID) | ORAL | 1 refills | Status: AC

## 2024-11-02 NOTE — Patient Instructions (Signed)

## 2024-11-02 NOTE — Progress Notes (Signed)
 "  Subjective:    Patient ID: Nichole Ramirez, female    DOB: December 11, 2005, 18 y.o.   MRN: [id number]   Chief Complaint: ADD  HPI  - ADD-Patient comes in today for follow up. She has a history of ADD. The last thing she was on was edwin, which was over  a year ago.That seemed to work the best for her. After graduating from high school she only took occasionally. She is currently not  in college, is thinking about going  to [employer]. She is currently working at [employer]. Not really having any trouble focusing. Would like to go back on straterra especiallly when she starts school.   Todays ASRS results score was 10  - periscapular pain- left side. Started 4 days ago. Describes pain as stabbing and shooting pain. Rates pain 1/10 currently. But when moving her arm can go up to 10/10. Almost like a muscle spasm.   Patient Active Problem List   Diagnosis Date Noted   Marijuana smoker 01/04/2021   ADD (attention deficit disorder) without hyperactivity 12/25/2016       Review of Systems  Constitutional:  Negative for diaphoresis.  Eyes:  Negative for pain.  Respiratory:  Negative for shortness of breath.   Cardiovascular:  Negative for chest pain, palpitations and leg swelling.  Gastrointestinal:  Negative for abdominal pain.  Endocrine: Negative for polydipsia.  Skin:  Negative for rash.  Neurological:  Negative for dizziness, weakness and headaches.  Hematological:  Does not bruise/bleed easily.  All other systems reviewed and are negative.      Objective:   Physical Exam Vitals and nursing note reviewed.  Constitutional:      General: She is not in acute distress.    Appearance: Normal appearance. She is well-developed.  Neck:     Vascular: No carotid bruit or JVD.  Cardiovascular:     Rate and Rhythm: Normal rate and regular rhythm.     Heart sounds: Normal heart sounds.  Pulmonary:     Effort: Pulmonary effort is normal. No respiratory distress.     Breath sounds:  Normal breath sounds. No wheezing or rales.  Chest:     Chest wall: No tenderness.  Abdominal:     General: There is no abdominal bruit.     Palpations: There is no hepatomegaly, splenomegaly or pulsatile mass.  Musculoskeletal:        General: Normal range of motion.     Cervical back: Normal range of motion and neck supple.     Comments: Left periscapular pain on palpation. Only pain with movement is internal rotation of shoulder.  Lymphadenopathy:     Cervical: No cervical adenopathy.  Skin:    General: Skin is warm and dry.  Neurological:     Mental Status: She is alert and oriented to person, place, and time.     Deep Tendon Reflexes: Reflexes are normal and symmetric.  Psychiatric:        Behavior: Behavior normal.        Thought Content: Thought content normal.        Judgment: Judgment normal.    BP 111/71   Pulse 80   Ht 5' 7 (1.702 m)   Wt 168 lb (76.2 kg)   SpO2 99%   BMI 26.31 kg/m         Assessment & Plan:   Nichole Ramirez in today with chief complaint of Medical Management of Chronic Issues (Follow up)   1. Attention or concentration  deficit (Primary) Stress management - atomoxetine  (STRATTERA ) 10 MG capsule; Take 1 capsule (10 mg total) by mouth daily.  Dispense: 30 capsule; Refill: 2  2. Periscapular pain of left shoulder Moist heat Rest Felexeril only at bedtime- sedation precautions RTO prn - naproxen  (NAPROSYN ) 500 MG tablet; Take 1 tablet (500 mg total) by mouth 2 (two) times daily with a meal.  Dispense: 60 tablet; Refill: 1 - cyclobenzaprine  (FLEXERIL ) 5 MG tablet; Take 1 tablet (5 mg total) by mouth 3 (three) times daily as needed for muscle spasms.  Dispense: 30 tablet; Refill: 0    The above assessment and management plan was discussed with the patient. The patient verbalized understanding of and has agreed to the management plan. Patient is aware to call the clinic if symptoms persist or worsen. Patient is aware when to return to the  clinic for a follow-up visit. Patient educated on when it is appropriate to go to the emergency department.   Mary-Margaret Gladis, FNP   "
